# Patient Record
Sex: Male | Born: 1970 | Race: White | Hispanic: No | Marital: Married | Smoking: Former smoker
Health system: Southern US, Community
[De-identification: ages and names within clinical notes are randomized; demographics above are authoritative.]

## PROBLEM LIST (undated history)

## (undated) DIAGNOSIS — E0590 Thyrotoxicosis, unspecified without thyrotoxic crisis or storm: Secondary | ICD-10-CM

## (undated) DIAGNOSIS — F419 Anxiety disorder, unspecified: Secondary | ICD-10-CM

## (undated) DIAGNOSIS — E785 Hyperlipidemia, unspecified: Secondary | ICD-10-CM

---

## 2009-05-08 ENCOUNTER — Emergency Department: Payer: Self-pay | Admitting: Emergency Medicine

## 2009-05-16 ENCOUNTER — Emergency Department: Payer: Self-pay | Admitting: Unknown Physician Specialty

## 2010-02-18 ENCOUNTER — Emergency Department: Payer: Self-pay | Admitting: Unknown Physician Specialty

## 2010-08-02 ENCOUNTER — Inpatient Hospital Stay: Payer: Self-pay | Admitting: Psychiatry

## 2010-08-02 ENCOUNTER — Observation Stay: Payer: Self-pay | Admitting: Specialist

## 2011-05-25 ENCOUNTER — Emergency Department: Payer: Self-pay | Admitting: *Deleted

## 2013-05-18 DIAGNOSIS — Z0000 Encounter for general adult medical examination without abnormal findings: Secondary | ICD-10-CM | POA: Insufficient documentation

## 2013-05-26 DIAGNOSIS — E785 Hyperlipidemia, unspecified: Secondary | ICD-10-CM | POA: Insufficient documentation

## 2013-07-17 IMAGING — CR MANDIBLE - 1-3 VIEW
1 series · 3 of 3 positions shown · non-contrast
Comparison: none

REASON FOR EXAM: injury
COMMENTS:

PROCEDURE:     DXR - DXR MANDIBLE  PARTIAL  - May 25, 2011  [DATE]
RESULT:     There is no evidence of fracture, dislocation, or malalignment.

[Series 1: view not recorded · 0.17mm/px · 3 of 3 slices shown]
[im 1/3]
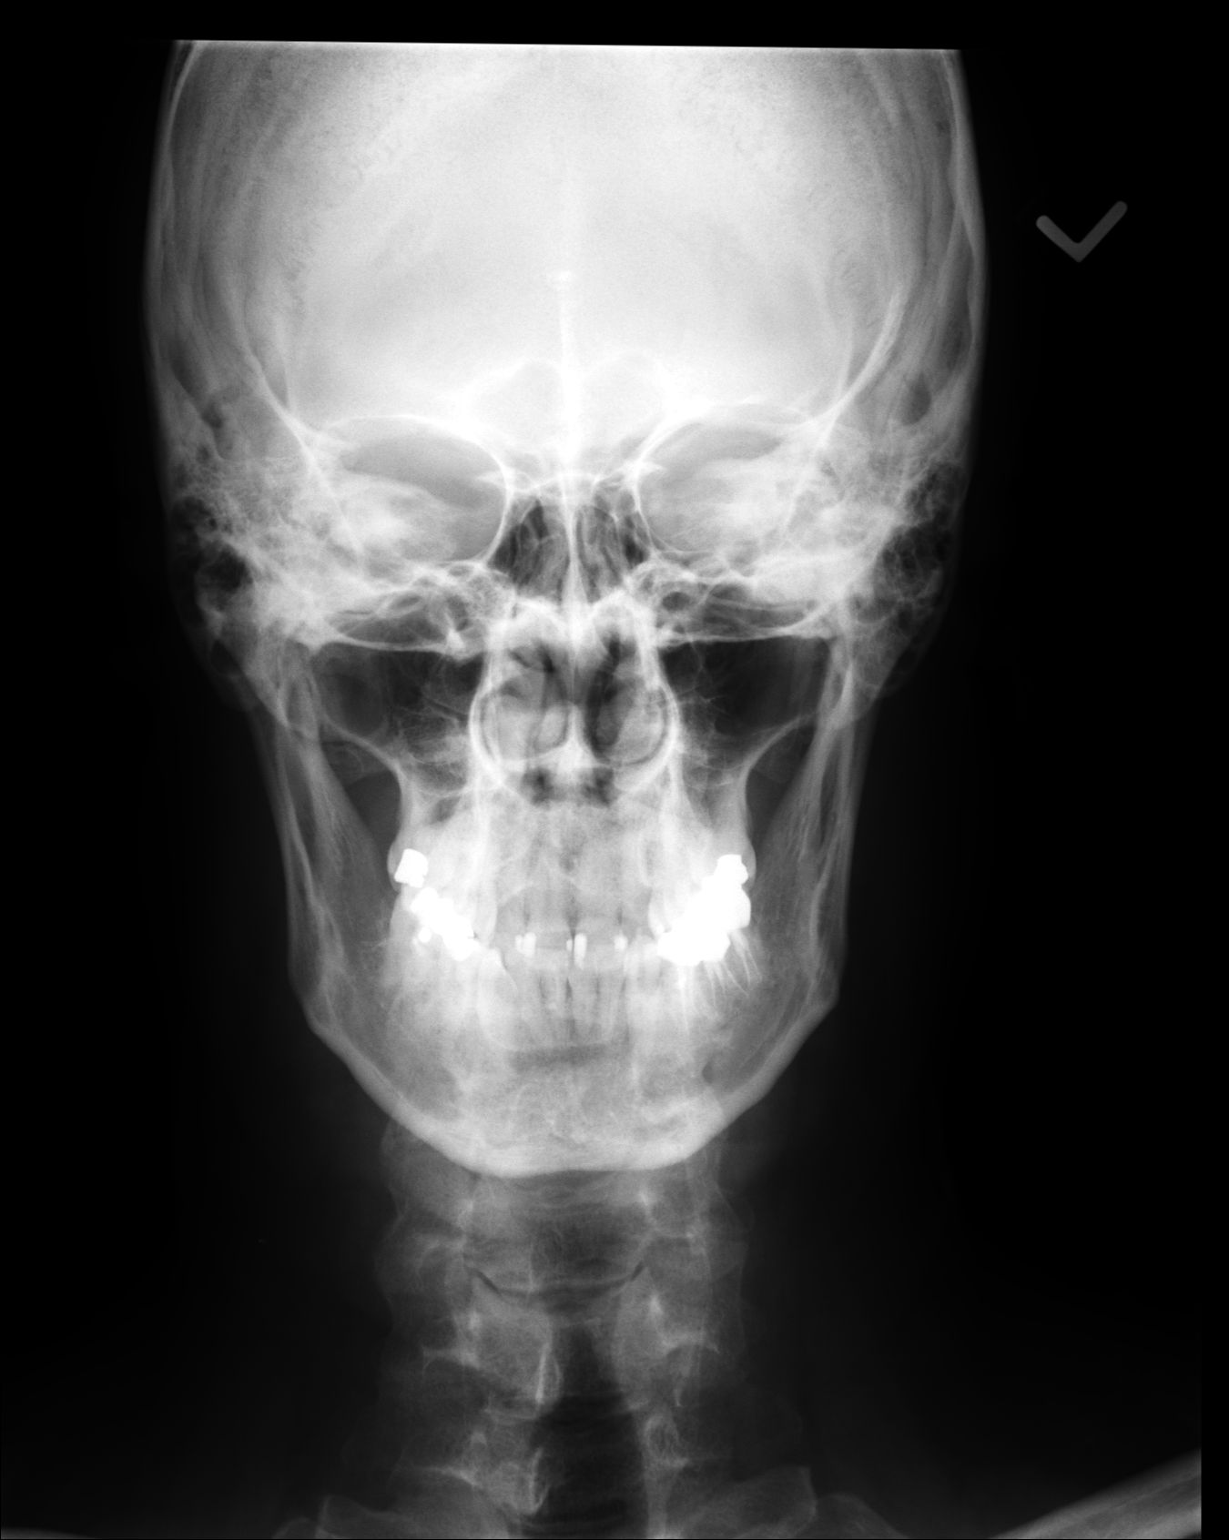
[im 2/3]
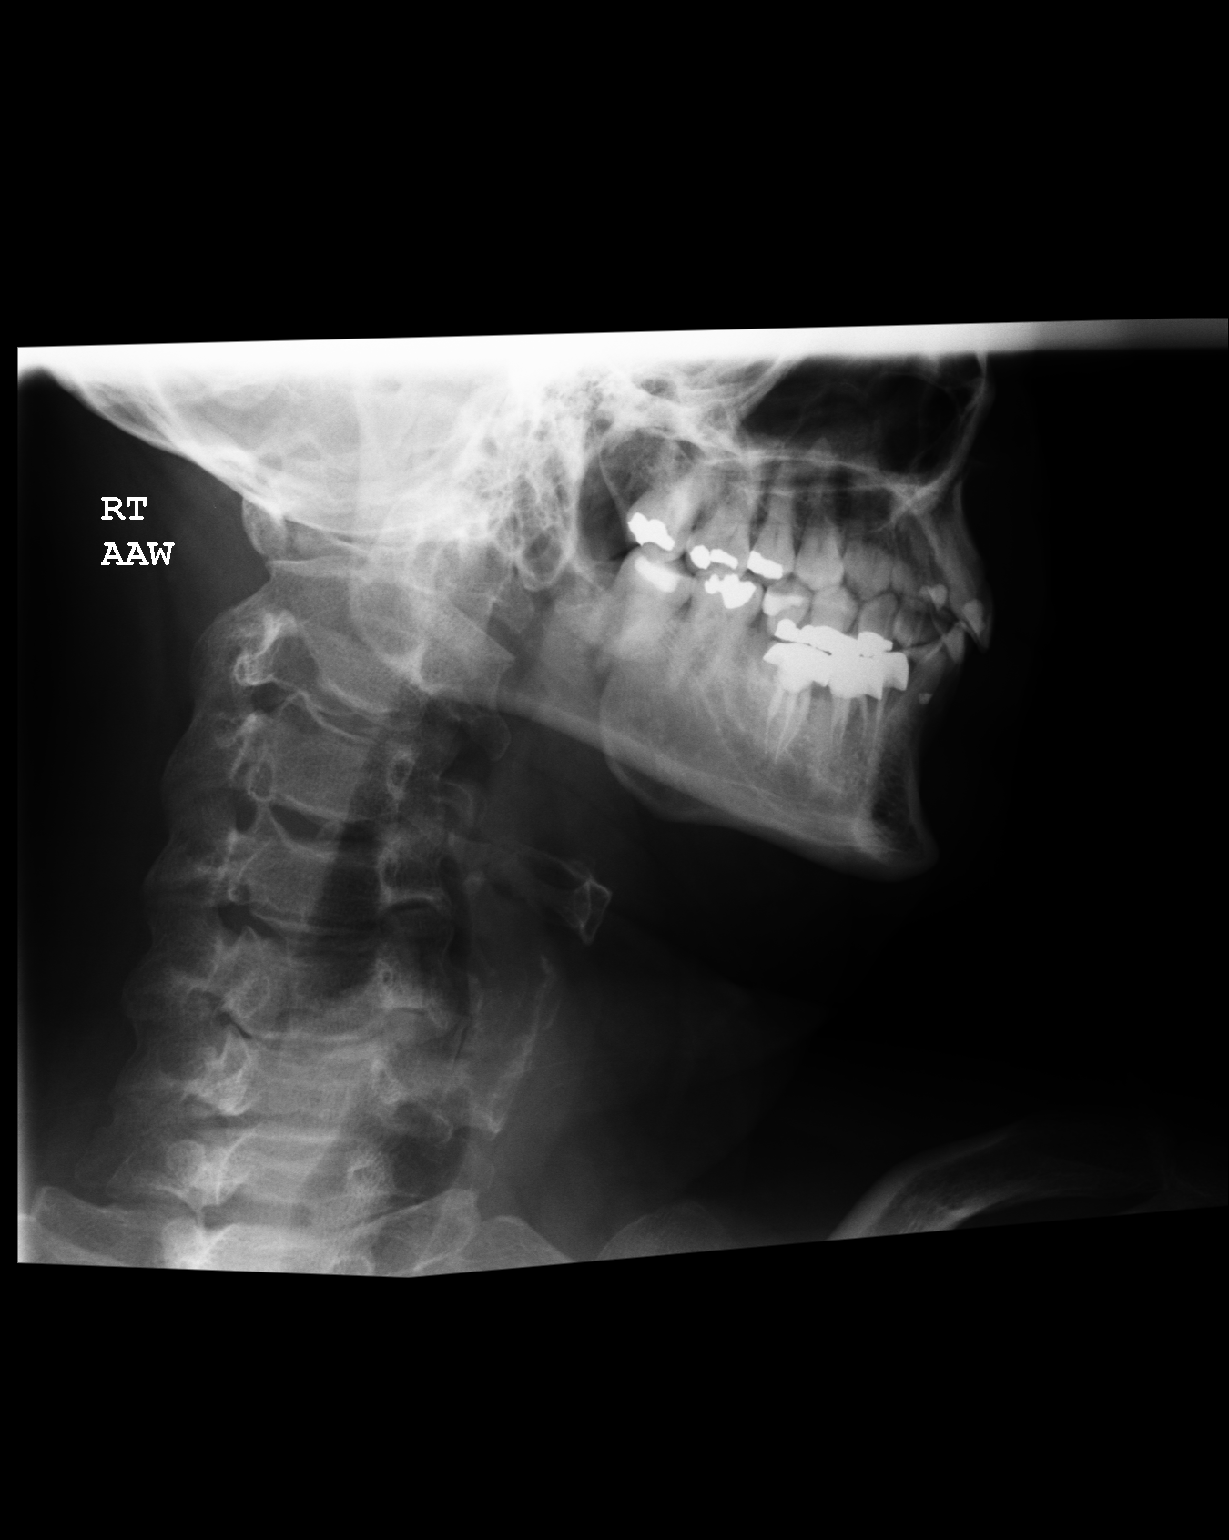
[im 3/3]
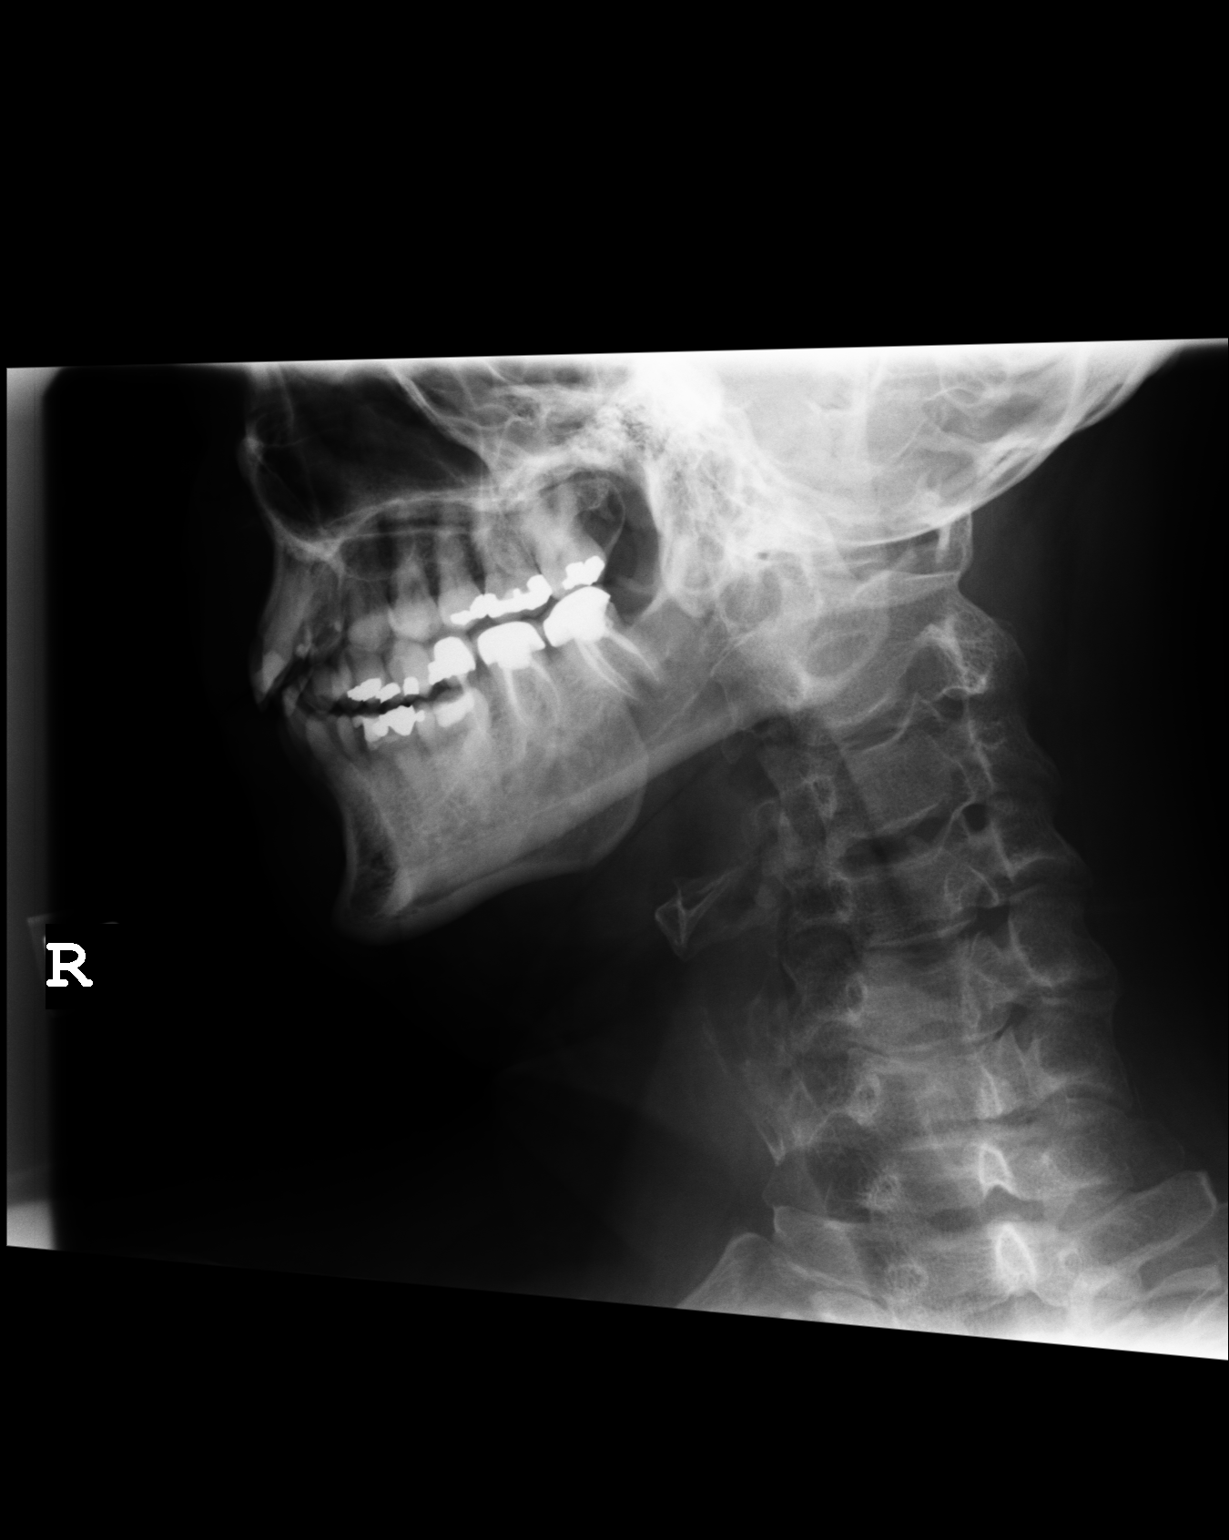

[3 of 3 positions shown; findings below may reference images not displayed]

IMPRESSION: 1. No evidence of acute abnormalities.
2. If there are persistent complaints of pain or persistent clinical
concern, a repeat evaluation in 7-10 days and/or CT is recommended if
clinically warranted.

## 2013-09-02 ENCOUNTER — Emergency Department: Payer: Self-pay | Admitting: Emergency Medicine

## 2015-10-13 DIAGNOSIS — F439 Reaction to severe stress, unspecified: Secondary | ICD-10-CM | POA: Insufficient documentation

## 2015-10-26 IMAGING — CR DG KNEE COMPLETE 4+V*R*
1 series · 4 of 4 positions shown · non-contrast
Comparison: None.

CLINICAL DATA: Injured knee skiing 4 weeks ago, continued pain

EXAM:
RIGHT KNEE - COMPLETE 4+ VIEW

[Series 1: oblique · 0.17mm/px · 4 of 4 slices shown]
[im 1/4]
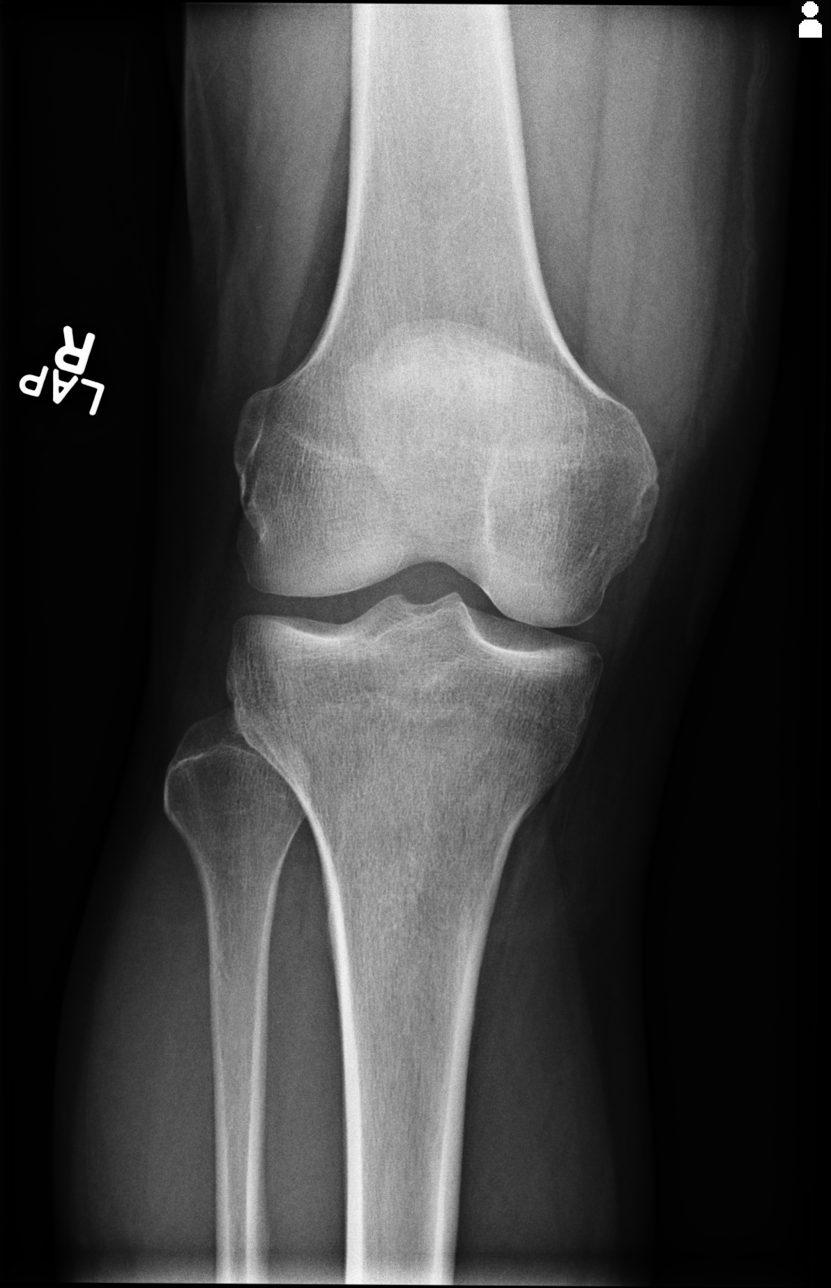
[im 2/4]
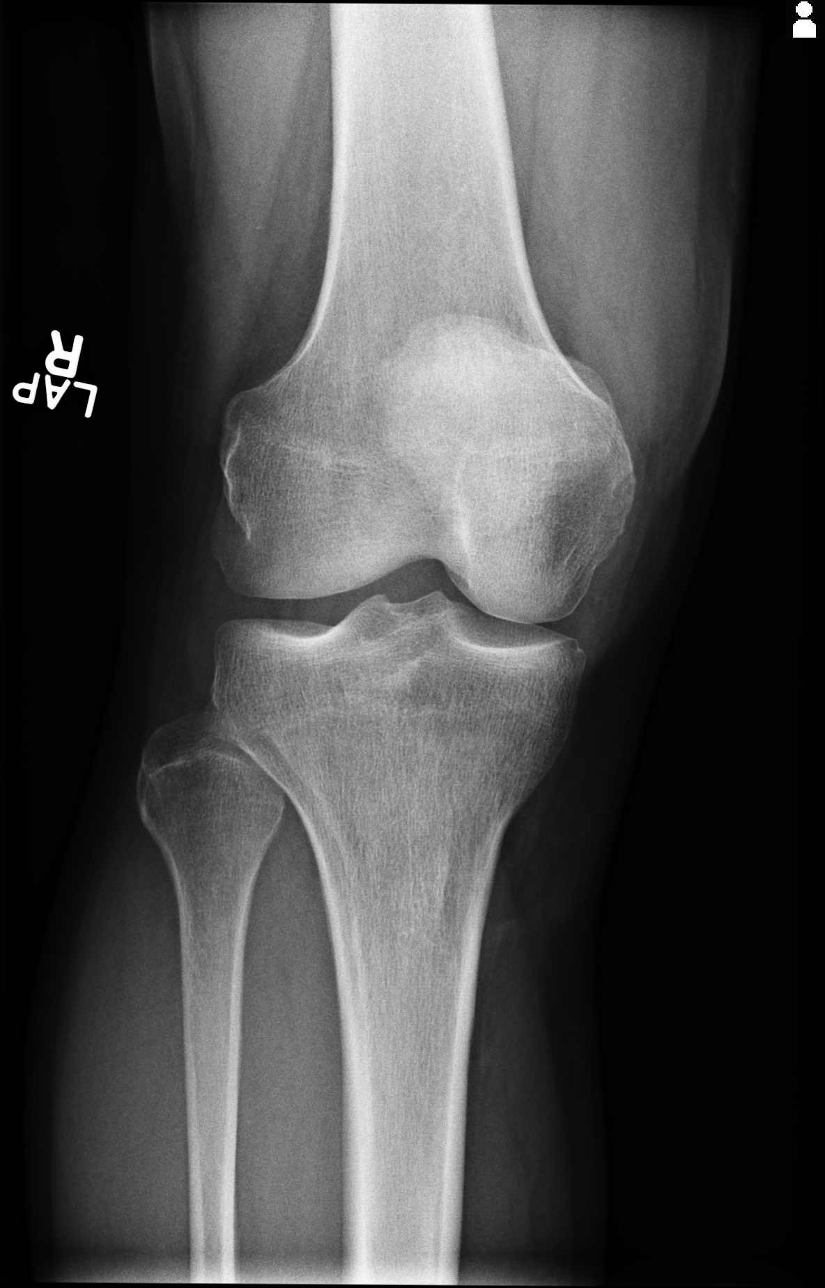
[im 3/4]
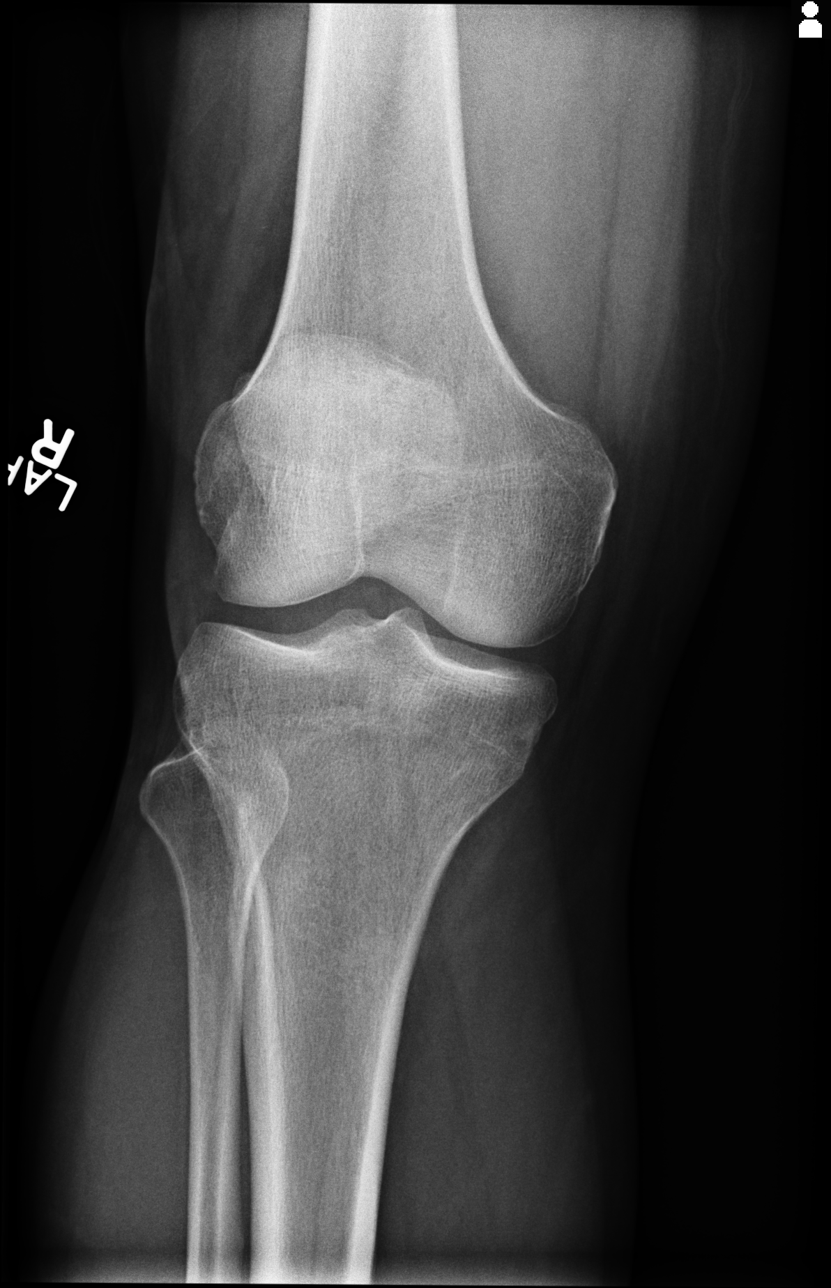
[im 4/4]
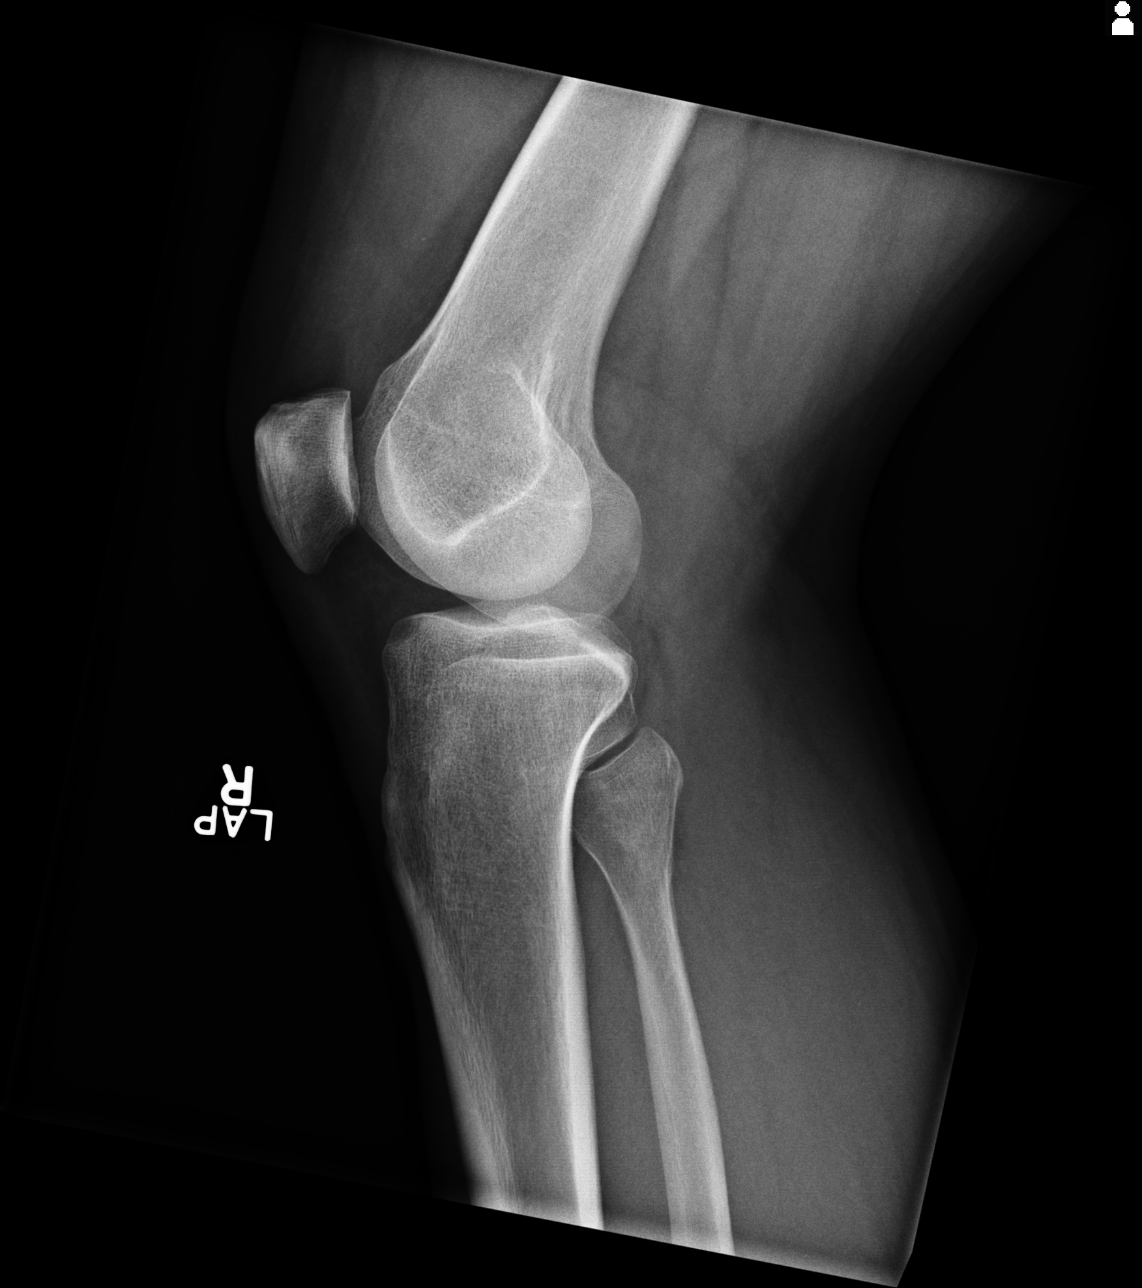

[4 of 4 positions shown; findings below may reference images not displayed]

FINDINGS: There is no evidence of fracture, dislocation, or joint effusion.
There is no evidence of arthropathy or other focal bone abnormality.
Soft tissues are unremarkable.
IMPRESSION: Negative.

## 2015-11-23 DIAGNOSIS — R1032 Left lower quadrant pain: Secondary | ICD-10-CM | POA: Insufficient documentation

## 2017-01-19 DIAGNOSIS — G479 Sleep disorder, unspecified: Secondary | ICD-10-CM | POA: Insufficient documentation

## 2017-06-08 ENCOUNTER — Ambulatory Visit: Payer: Self-pay | Admitting: Podiatry

## 2017-06-12 ENCOUNTER — Ambulatory Visit (INDEPENDENT_AMBULATORY_CARE_PROVIDER_SITE_OTHER): Payer: BLUE CROSS/BLUE SHIELD

## 2017-06-12 ENCOUNTER — Encounter: Payer: Self-pay | Admitting: Podiatry

## 2017-06-12 ENCOUNTER — Ambulatory Visit: Payer: BLUE CROSS/BLUE SHIELD

## 2017-06-12 ENCOUNTER — Ambulatory Visit: Payer: BLUE CROSS/BLUE SHIELD | Admitting: Podiatry

## 2017-06-12 DIAGNOSIS — M722 Plantar fascial fibromatosis: Secondary | ICD-10-CM

## 2017-06-12 MED ORDER — METHYLPREDNISOLONE 4 MG PO TBPK: ORAL_TABLET | ORAL | 0 refills | Status: DC

## 2017-06-12 MED ORDER — DICLOFENAC SODIUM 75 MG PO TBEC: 75.0000 mg | DELAYED_RELEASE_TABLET | Freq: Two times a day (BID) | ORAL | 1 refills | Status: DC

## 2017-06-12 NOTE — Progress Notes (Signed)
   Subjective:    Patient ID: Sean Campbell, male    DOB: [birth year], 46 y.o.   MRN: [id number]  HPI    Review of Systems  All other systems reviewed and are negative.      Objective:   Physical Exam        Assessment & Plan:

## 2017-06-14 NOTE — Progress Notes (Signed)
   Subjective: Patient presents today for pain and tenderness in the bilateral heels and ankles that has been ongoing for the past 3 months. He describes the pain as pins and needles when walking and throbbing when he is without movement. He states the pain started in the heels and now radiates into the ankles. His left is worse than the right and the pain is worse in the morning. He has not done anything to treat the symptoms. Patient presents today for further treatment and evaluation.   No past medical history on file.   Objective: Physical Exam General: The patient is alert and oriented x3 in no acute distress.  Dermatology: Skin is warm, dry and supple bilateral lower extremities. Negative for open lesions or macerations bilateral.   Vascular: Dorsalis Pedis and Posterior Tibial pulses palpable bilateral.  Capillary fill time is immediate to all digits.  Neurological: Epicritic and protective threshold intact bilateral.   Musculoskeletal: Tenderness to palpation at the medial calcaneal tubercale and through the insertion of the plantar fascia of the left foot. All other joints range of motion within normal limits bilateral. Strength 5/5 in all groups bilateral.   Radiographic exam:   Normal osseous mineralization. Joint spaces preserved. No fracture/dislocation/boney destruction. Calcaneal spur present with mild thickening of plantar fascia left. No other soft tissue abnormalities or radiopaque foreign bodies.   Assessment: 1. Plantar fasciitis left foot  Plan of Care:  1. Patient evaluated. Xrays reviewed.   2. Injection of 0.5cc Celestone soluspan injected into the left plantar fascia.  3. Rx for Medrol Dose Pak placed 4. Rx for Diclofenac 75mg  PO BID ordered for patient. 5. Plantar fascial band(s) dispensed  6. Instructed patient regarding therapies and modalities at home to alleviate symptoms.  7. Return to clinic in 4 weeks.     Edrick Kins, DPM [facility]  Dr. Edrick Kins, DPM    [address], [place] [address]                Office [phone]  Fax [phone]

## 2017-07-17 ENCOUNTER — Ambulatory Visit (INDEPENDENT_AMBULATORY_CARE_PROVIDER_SITE_OTHER): Payer: BLUE CROSS/BLUE SHIELD | Admitting: Podiatry

## 2017-07-17 ENCOUNTER — Encounter: Payer: Self-pay | Admitting: Podiatry

## 2017-07-17 DIAGNOSIS — G4700 Insomnia, unspecified: Secondary | ICD-10-CM | POA: Insufficient documentation

## 2017-07-17 DIAGNOSIS — M722 Plantar fascial fibromatosis: Secondary | ICD-10-CM

## 2017-07-17 DIAGNOSIS — N529 Male erectile dysfunction, unspecified: Secondary | ICD-10-CM | POA: Insufficient documentation

## 2017-07-19 NOTE — Progress Notes (Signed)
   HPI: 47 year old male presenting today for follow up evaluation of left plantar fasciitis. He states he is doing much better and reports being 95% better. He has been wearing the fascial brace and taking Diclofenac with moderate relief. Patient is here for further evaluation and treatment.   No past medical history on file.   Physical Exam: General: The patient is alert and oriented x3 in no acute distress.  Dermatology: Skin is warm, dry and supple bilateral lower extremities. Negative for open lesions or macerations.  Vascular: Palpable pedal pulses bilaterally. No edema or erythema noted. Capillary refill within normal limits.  Neurological: Epicritic and protective threshold grossly intact bilaterally.   Musculoskeletal Exam: Range of motion within normal limits to all pedal and ankle joints bilateral. Muscle strength 5/5 in all groups bilateral.    Assessment: - plantar fasciitis left - resolved   Plan of Care:  - Patient evaluated. - Recommended good shoe gear. - Continue wearing plantar fascial brace and taking Diclofenac as needed. - Return to clinic when necessary.    Sean Campbell, DPM [facility]  Dr. Edrick Campbell, DPM    [address],  [address]                Office [phone]  Fax [phone]

## 2017-08-15 ENCOUNTER — Telehealth: Payer: Self-pay | Admitting: *Deleted

## 2017-08-15 MED ORDER — DICLOFENAC SODIUM 75 MG PO TBEC: 75.0000 mg | DELAYED_RELEASE_TABLET | Freq: Two times a day (BID) | ORAL | 1 refills | Status: DC

## 2017-08-15 NOTE — Telephone Encounter (Signed)
Pt left name and phone number. 

## 2017-08-15 NOTE — Telephone Encounter (Signed)
Pt requested refill of the diclofenac, states he is starting to have pain again, is having to go to [place] to work and will be walking a lot. Dr. Amalia Hailey states refill as previously.

## 2017-08-24 ENCOUNTER — Ambulatory Visit (INDEPENDENT_AMBULATORY_CARE_PROVIDER_SITE_OTHER): Payer: BLUE CROSS/BLUE SHIELD

## 2017-08-24 ENCOUNTER — Ambulatory Visit (INDEPENDENT_AMBULATORY_CARE_PROVIDER_SITE_OTHER): Payer: BLUE CROSS/BLUE SHIELD | Admitting: Podiatry

## 2017-08-24 ENCOUNTER — Encounter: Payer: Self-pay | Admitting: Podiatry

## 2017-08-24 DIAGNOSIS — S93692A Other sprain of left foot, initial encounter: Secondary | ICD-10-CM

## 2017-08-24 MED ORDER — DIAZEPAM 5 MG PO TABS: 5.0000 mg | ORAL_TABLET | Freq: Two times a day (BID) | ORAL | 1 refills | Status: DC | PRN

## 2017-08-24 NOTE — Progress Notes (Signed)
   HPI: Patient presents today for new complaint regarding pain and tenderness to the left foot.  Patient states that his plantar fasciitis was doing very well until he was working and he twisted his foot and he heard a loud audible pop on the bottom of his foot.  He was immediately not able to bear weight on his foot.  He states the pain is gotten worse over the last 2 days.  The pain in the patient's symptoms have gotten worse and is unable to sleep at night and he states that he has some anxiety regarding his current condition.  He presents for further treatment evaluation   No past medical history on file.   Physical Exam: General: The patient is alert and oriented x3 in no acute distress.  Dermatology: Skin is warm, dry and supple bilateral lower extremities. Negative for open lesions or macerations.  Vascular: Palpable pedal pulses bilaterally. No edema or erythema noted. Capillary refill within normal limits.  Neurological: Epicritic and protective threshold grossly intact bilaterally.   Musculoskeletal Exam: Range of motion within normal limits to all pedal and ankle joints bilateral. Muscle strength 5/5 in all groups bilateral.  Moderate edema with significant pain on palpation to the lateral band of the plantar fascia mid arch.  Likely consistent with a possible plantar fascial rupture.   Assessment: -Rupture plantar fascia left foot lateral band   Plan of Care:  - Patient evaluated. -Today we are going to dispense an immobilization cam boot.  Weightbearing as tolerated times 4 weeks. -Continue taking oral diclofenac 75 mg twice daily -Prescription for Valium  5 mg #30 1 refill as needed anxiety due to current condition -Return to clinic in 4 weeks   Edrick Kins, DPM [facility]  Dr. Edrick Kins, DPM    [address], [place] [address]                Office [phone]  Fax [phone]

## 2017-09-21 ENCOUNTER — Ambulatory Visit: Payer: BLUE CROSS/BLUE SHIELD | Admitting: Podiatry

## 2017-09-21 ENCOUNTER — Ambulatory Visit: Payer: Self-pay | Admitting: Unknown Physician Specialty

## 2017-09-28 ENCOUNTER — Encounter: Payer: Self-pay | Admitting: Unknown Physician Specialty

## 2017-09-28 ENCOUNTER — Ambulatory Visit (INDEPENDENT_AMBULATORY_CARE_PROVIDER_SITE_OTHER): Payer: BLUE CROSS/BLUE SHIELD | Admitting: Unknown Physician Specialty

## 2017-09-28 VITALS — BP 132/76 | HR 76 | Temp 97.8°F | Ht 69.3 in | Wt 215.7 lb

## 2017-09-28 DIAGNOSIS — F322 Major depressive disorder, single episode, severe without psychotic features: Secondary | ICD-10-CM | POA: Diagnosis not present

## 2017-09-28 DIAGNOSIS — L290 Pruritus ani: Secondary | ICD-10-CM

## 2017-09-28 DIAGNOSIS — Z1211 Encounter for screening for malignant neoplasm of colon: Secondary | ICD-10-CM

## 2017-09-28 DIAGNOSIS — G4730 Sleep apnea, unspecified: Secondary | ICD-10-CM | POA: Insufficient documentation

## 2017-09-28 DIAGNOSIS — G4733 Obstructive sleep apnea (adult) (pediatric): Secondary | ICD-10-CM | POA: Diagnosis not present

## 2017-09-28 DIAGNOSIS — F5101 Primary insomnia: Secondary | ICD-10-CM

## 2017-09-28 MED ORDER — CITALOPRAM HYDROBROMIDE 20 MG PO TABS: 20.0000 mg | ORAL_TABLET | Freq: Every day | ORAL | 3 refills | Status: DC

## 2017-09-28 NOTE — Patient Instructions (Addendum)

## 2017-09-28 NOTE — Assessment & Plan Note (Addendum)
Unable to complete a sleep study previously.  Schedule sleep study

## 2017-09-28 NOTE — Progress Notes (Signed)
BP 132/76   Pulse 76   Temp 97.8 F (36.6 C) (Oral)   Ht 5' 9.3" (1.76 m)   Wt 215 lb 11.2 oz (97.8 kg)   SpO2 98%   BMI 31.58 kg/m    Subjective:    Patient ID: Sean Campbell, male    DOB: 1970-10-25, 47 y.o.   MRN: [id number]  HPI: Sean Campbell is a 47 y.o. male  Chief Complaint  Patient presents with  . Establish Care  . Depression   Depression Struggles with depression.  Tried Valium which makes him sleepy and groggy.  Has a friend who recently got on Cymbalta which helped.  Sleep is a problem.    Depression screen PHQ 2/9 09/28/2017  Decreased Interest 3  Down, Depressed, Hopeless 2  PHQ - 2 Score 5  Altered sleeping 3  Tired, decreased energy 3  Change in appetite 3  Feeling bad or failure about yourself  1  Trouble concentrating 3  Moving slowly or fidgety/restless 2  Suicidal thoughts 1  PHQ-9 Score 21   Hyperlipidemia Noted last time he had a physical.  States he put on some weight.    The 10-year ASCVD risk score Mikey Bussing DC Brooke Bonito., et al., 2013) is: 6.3%   Values used to calculate the score:     Age: 74 years     Sex: Male     Is Non-Hispanic African American: No     Diabetic: No     Tobacco smoker: No     Systolic Blood Pressure: 825 mmHg     Is BP treated: No     HDL Cholesterol: 41 mg/dL     Total Cholesterol: 269 mg/dL   Rectal itching Noted at night.  Also due to colon cancer screening.  Sleep apnea Snores at night.  Tired most of the time.  Often needs to pull over.  Wakes up exhausted.    Social History   Socioeconomic History  . Marital status: Married    Spouse name: Not on file  . Number of children: Not on file  . Years of education: Not on file  . Highest education level: Not on file  Occupational History  . Not on file  Social Needs  . Financial resource strain: Not on file  . Food insecurity:    Worry: Not on file    Inability: Not on file  . Transportation needs:    Medical: Not on file    Non-medical: Not on file  Tobacco  Use  . Smoking status: Former Research scientist (life sciences)  . Smokeless tobacco: Never Used  Substance and Sexual Activity  . Alcohol use: Yes    Comment: 6 pack per week  . Drug use: No  . Sexual activity: Yes  Lifestyle  . Physical activity:    Days per week: Not on file    Minutes per session: Not on file  . Stress: Not on file  Relationships  . Social connections:    Talks on phone: Not on file    Gets together: Not on file    Attends religious service: Not on file    Active member of club or organization: Not on file    Attends meetings of clubs or organizations: Not on file    Relationship status: Not on file  . Intimate partner violence:    Fear of current or ex partner: Not on file    Emotionally abused: Not on file    Physically abused: Not on file  Forced sexual activity: Not on file  Other Topics Concern  . Not on file  Social History Narrative  . Not on file   Family History  Problem Relation Age of Onset  . Heart disease Father   . Cancer Maternal Grandmother        colon  . Heart disease Paternal Grandmother   . Heart disease Paternal Grandfather    History reviewed. No pertinent past medical history.  History reviewed. No pertinent surgical history.    Relevant past medical, surgical, family and social history reviewed and updated as indicated. Interim medical history since our last visit reviewed. Allergies and medications reviewed and updated.  Review of Systems  Per HPI unless specifically indicated above     Objective:    BP 132/76   Pulse 76   Temp 97.8 F (36.6 C) (Oral)   Ht 5' 9.3" (1.76 m)   Wt 215 lb 11.2 oz (97.8 kg)   SpO2 98%   BMI 31.58 kg/m   Wt Readings from Last 3 Encounters:  09/28/17 215 lb 11.2 oz (97.8 kg)    Physical Exam  Constitutional: He is oriented to person, place, and time. He appears well-developed and well-nourished. No distress.  HENT:  Head: Normocephalic and atraumatic.  Eyes: Conjunctivae and lids are normal. Right  eye exhibits no discharge. Left eye exhibits no discharge. No scleral icterus.  Neck: Normal range of motion. Neck supple. No JVD present. Carotid bruit is not present.  Cardiovascular: Normal rate, regular rhythm and normal heart sounds.  Pulmonary/Chest: Effort normal and breath sounds normal. No respiratory distress.  Abdominal: Normal appearance. There is no splenomegaly or hepatomegaly.  Musculoskeletal: Normal range of motion.  Neurological: He is alert and oriented to person, place, and time.  Skin: Skin is warm, dry and intact. No rash noted. No pallor.  Psychiatric: He has a normal mood and affect. His behavior is normal. Judgment and thought content normal.    No results found for this or any previous visit.    Assessment & Plan:   Problem List Items Addressed This Visit      Unprioritized   Depression, major, single episode, severe ([place])    Start Citalopram 20 mg daily. I've explained to her that drugs of the SSRI class can have side effects such as weight gain, sexual dysfunction, insomnia, headache, nausea. These medications are generally effective at alleviating symptoms of anxiety and/or depression. Let me know if significant side effects do occur.        Relevant Medications   citalopram (CELEXA) 20 MG tablet   Insomnia    Significant problem.  Discussed sleep hygeine.  Exercise      Sleep apnea    Unable to complete a sleep study previously.  Schedule sleep study      Relevant Orders   Ambulatory referral to Sleep Studies    Other Visit Diagnoses    Rectal itching    -  Primary   Relevant Orders   Ambulatory referral to Gastroenterology   Colon cancer screening       Relevant Orders   Ambulatory referral to Gastroenterology      Last labs done at [facility] 01/19/17.  Reviewed.  Everything normal but LDL 191.  Pt ed  Follow up plan: Return in about 1 month (around 10/26/2017) for physcal.

## 2017-09-28 NOTE — Assessment & Plan Note (Signed)
Significant problem.  Discussed sleep hygeine.  Exercise

## 2017-09-28 NOTE — Assessment & Plan Note (Signed)
Start Citalopram 20 mg daily. I've explained to her that drugs of the SSRI class can have side effects such as weight gain, sexual dysfunction, insomnia, headache, nausea. These medications are generally effective at alleviating symptoms of anxiety and/or depression. Let me know if significant side effects do occur.

## 2017-10-10 ENCOUNTER — Other Ambulatory Visit: Payer: Self-pay

## 2017-10-10 ENCOUNTER — Encounter: Payer: Self-pay | Admitting: Gastroenterology

## 2017-10-10 ENCOUNTER — Ambulatory Visit (INDEPENDENT_AMBULATORY_CARE_PROVIDER_SITE_OTHER): Payer: BLUE CROSS/BLUE SHIELD | Admitting: Gastroenterology

## 2017-10-10 VITALS — BP 133/76 | HR 65 | Ht 69.3 in | Wt 210.4 lb

## 2017-10-10 DIAGNOSIS — Z1211 Encounter for screening for malignant neoplasm of colon: Secondary | ICD-10-CM

## 2017-10-10 DIAGNOSIS — Z800 Family history of malignant neoplasm of digestive organs: Secondary | ICD-10-CM

## 2017-10-10 MED ORDER — PEG 3350-KCL-NA BICARB-NACL 420 G PO SOLR: 4000.0000 mL | Freq: Once | ORAL | 0 refills | Status: AC

## 2017-10-10 MED ORDER — BISACODYL 5 MG PO TBEC: 10.0000 mg | DELAYED_RELEASE_TABLET | Freq: Once | ORAL | 0 refills | Status: AC

## 2017-10-28 NOTE — Progress Notes (Signed)
Vonda Antigua [address]  [place]  [place], [place] [address]  Main: [phone]  Fax: [phone]   Gastroenterology Consultation  Referring Provider:     Kathrine Haddock, NP Primary Care Physician:  Kathrine Haddock, NP Primary Gastroenterologist:  Dr. Vonda Antigua Reason for Consultation:     Rectal itching        HPI:   Sean Campbell is a 47 y.o. y/o male referred for consultation & management  by Dr. Kathrine Haddock, NP.  Patient reports 2 to 20-month history of itching.  Occurs multiple times a day, does not occur at night.  No exacerbating or relieving factors.  No blood per rectum.  No weight loss, no abdominal pain.  No altered bowel habits.  No family history of colon cancer.  No previous colonoscopy.  History reviewed. No pertinent past medical history.  History reviewed. No pertinent surgical history.  Prior to Admission medications   Medication Sig Start Date End Date Taking? Authorizing Provider  citalopram (CELEXA) 20 MG tablet Take 1 tablet (20 mg total) by mouth daily. 09/28/17  Yes Kathrine Haddock, NP  diazepam (VALIUM) 5 MG tablet Take 1 tablet (5 mg total) by mouth every 12 (twelve) hours as needed for anxiety. 08/24/17  Yes Edrick Kins, DPM  sildenafil (VIAGRA) 100 MG tablet USE AS DIRECTED. DO NOT TAKE MORE THAN 100 MG IN 24 HOURS 01/19/17  Yes [provider]    Family History  Problem Relation Age of Onset  . Heart disease Father   . Cancer Maternal Grandmother        colon  . Heart disease Paternal Grandmother   . Heart disease Paternal Grandfather      Social History   Tobacco Use  . Smoking status: Former Research scientist (life sciences)  . Smokeless tobacco: Never Used  Substance Use Topics  . Alcohol use: Yes    Comment: 6 pack per week  . Drug use: No    Allergies as of 10/10/2017  . (No Known Allergies)    Review of Systems:    All systems reviewed and negative except where noted in HPI.   Physical Exam:  BP 133/76   Pulse 65    Ht 5' 9.3" (1.76 m)   Wt 210 lb 6.4 oz (95.4 kg)   BMI 30.80 kg/m  No LMP for male patient. Psych:  Alert and cooperative. Normal mood and affect. General:   Alert,  Well-developed, well-nourished, pleasant and cooperative in NAD Head:  Normocephalic and atraumatic. Eyes:  Sclera clear, no icterus.   Conjunctiva pink. Ears:  Normal auditory acuity. Nose:  No deformity, discharge, or lesions. Mouth:  No deformity or lesions,oropharynx pink & moist. Neck:  Supple; no masses or thyromegaly. Lungs:  Respirations even and unlabored.  Clear throughout to auscultation.   No wheezes, crackles, or rhonchi. No acute distress. Heart:  Regular rate and rhythm; no murmurs, clicks, rubs, or gallops. Abdomen:  Normal bowel sounds.  No bruits.  Soft, non-tender and non-distended without masses, hepatosplenomegaly or hernias noted.  No guarding or rebound tenderness.    Msk:  Symmetrical without gross deformities. Good, equal movement & strength bilaterally. Pulses:  Normal pulses noted. Extremities:  No clubbing or edema.  No cyanosis. Neurologic:  Alert and oriented x3;  grossly normal neurologically. Skin:  Intact without significant lesions or rashes. No jaundice. Lymph Nodes:  No significant cervical adenopathy. Psych:  Alert and cooperative. Normal mood and affect.   Labs: CBC No results found for: WBC, RBC,  HGB, HCT, PLT, MCV, MCH, MCHC, RDW, LYMPHSABS, MONOABS, EOSABS, BASOSABS CMP  No results found for: NA, K, CL, CO2, GLUCOSE, BUN, CREATININE, CALCIUM, PROT, ALBUMIN, AST, ALT, ALKPHOS, BILITOT, GFRNONAA, GFRAA  Imaging Studies: No results found.  Assessment and Plan:   [name] is a 47 y.o. y/o male has been referred for rectal itching, and evaluation for screening colonoscopy  Rectum is doing possibly due to underlying hemorrhoids High-fiber diet recommended No alarm symptoms present  [place] recommend screening colonoscopy starting at 50 years of age.  However,  other GI socities,  recommend colonoscopies at 49 years of age, but recognize my American cancer Society guidelines.  This was discussed with the patient in detail.  Alternatives, risks and benefits discussed.  Patient chooses to have colonoscopy scheduled this time. Rectal exam, can be done at that time to evaluate for hemorrhoids, and etiology of itching  Other causes of itching, can include anal fissures or tears Patient asked to not wear tight clothing, not irritate the area with itching Keep the area dry and clean Will evaluate with colonoscopy  I have discussed alternative options, risks & benefits,  which include, but are not limited to, bleeding, infection, perforation,respiratory complication & drug reaction.  The patient agrees with this plan & written consent will be obtained.      Dr Vonda Antigua

## 2017-10-30 ENCOUNTER — Encounter: Payer: Self-pay | Admitting: Unknown Physician Specialty

## 2017-10-30 ENCOUNTER — Ambulatory Visit (INDEPENDENT_AMBULATORY_CARE_PROVIDER_SITE_OTHER): Payer: BLUE CROSS/BLUE SHIELD | Admitting: Unknown Physician Specialty

## 2017-10-30 ENCOUNTER — Encounter: Payer: BLUE CROSS/BLUE SHIELD | Admitting: Unknown Physician Specialty

## 2017-10-30 VITALS — BP 129/83 | HR 69 | Temp 98.2°F | Ht 69.3 in | Wt 211.6 lb

## 2017-10-30 DIAGNOSIS — L290 Pruritus ani: Secondary | ICD-10-CM | POA: Insufficient documentation

## 2017-10-30 DIAGNOSIS — F322 Major depressive disorder, single episode, severe without psychotic features: Secondary | ICD-10-CM | POA: Diagnosis not present

## 2017-10-30 DIAGNOSIS — Z0000 Encounter for general adult medical examination without abnormal findings: Secondary | ICD-10-CM | POA: Diagnosis not present

## 2017-10-30 DIAGNOSIS — G4733 Obstructive sleep apnea (adult) (pediatric): Secondary | ICD-10-CM | POA: Diagnosis not present

## 2017-10-30 MED ORDER — ALBENDAZOLE 200 MG PO TABS: ORAL_TABLET | ORAL | 0 refills | Status: DC

## 2017-10-30 MED ORDER — CITALOPRAM HYDROBROMIDE 20 MG PO TABS: 20.0000 mg | ORAL_TABLET | Freq: Every day | ORAL | 1 refills | Status: DC

## 2017-10-30 NOTE — Assessment & Plan Note (Signed)
Has not yet had sleep test

## 2017-10-30 NOTE — Assessment & Plan Note (Signed)
Discussed pinworms.  Pt doesn't want to do any confirmatory observance.  Agrees to presumptive treatment with Albendazole

## 2017-10-30 NOTE — Assessment & Plan Note (Signed)
This is resolved with current treatment

## 2017-10-30 NOTE — Progress Notes (Signed)
BP 129/83   Pulse 69   Temp 98.2 F (36.8 C) (Oral)   Ht 5' 9.3" (1.76 m)   Wt 211 lb 9.6 oz (96 kg)   SpO2 98%   BMI 30.98 kg/m    Subjective:    Patient ID: Sean Campbell, male    DOB: 1971-01-16, 47 y.o.   MRN: [id number]  HPI: Sean Campbell is a 47 y.o. male  Chief Complaint  Patient presents with  . Annual Exam   Depression Started Citalopram 20 mg.  States mood is better.  Has a slight headache. Not taking the Valoium Depression screen [facility] 2/9 10/30/2017 09/28/2017  Decreased Interest 0 3  Down, Depressed, Hopeless 0 2  PHQ - 2 Score 0 5  Altered sleeping 1 3  Tired, decreased energy 1 3  Change in appetite 0 3  Feeling bad or failure about yourself  0 1  Trouble concentrating 0 3  Moving slowly or fidgety/restless 0 2  Suicidal thoughts 0 1  PHQ-9 Score 2 21   Rectal itching Pt with continued persistent rectal itching at night.    Social History   Socioeconomic History  . Marital status: Married    Spouse name: Not on file  . Number of children: Not on file  . Years of education: Not on file  . Highest education level: Not on file  Occupational History  . Not on file  Social Needs  . Financial resource strain: Not on file  . Food insecurity:    Worry: Not on file    Inability: Not on file  . Transportation needs:    Medical: Not on file    Non-medical: Not on file  Tobacco Use  . Smoking status: Former Research scientist (life sciences)  . Smokeless tobacco: Never Used  Substance and Sexual Activity  . Alcohol use: Yes    Comment: 6 pack per week  . Drug use: No  . Sexual activity: Yes  Lifestyle  . Physical activity:    Days per week: Not on file    Minutes per session: Not on file  . Stress: Not on file  Relationships  . Social connections:    Talks on phone: Not on file    Gets together: Not on file    Attends religious service: Not on file    Active member of club or organization: Not on file    Attends meetings of clubs or organizations: Not on file   Relationship status: Not on file  . Intimate partner violence:    Fear of current or ex partner: Not on file    Emotionally abused: Not on file    Physically abused: Not on file    Forced sexual activity: Not on file  Other Topics Concern  . Not on file  Social History Narrative  . Not on file   Family History  Problem Relation Age of Onset  . Heart disease Father   . Cancer Maternal Grandmother        colon  . Heart disease Paternal Grandmother   . Heart disease Paternal Grandfather    History reviewed. No pertinent past medical history.  History reviewed. No pertinent surgical history.   Relevant past medical, surgical, family and social history reviewed and updated as indicated. Interim medical history since our last visit reviewed. Allergies and medications reviewed and updated.  Review of Systems  Per HPI unless specifically indicated above     Objective:    BP 129/83   Pulse 69  Temp 98.2 F (36.8 C) (Oral)   Ht 5' 9.3" (1.76 m)   Wt 211 lb 9.6 oz (96 kg)   SpO2 98%   BMI 30.98 kg/m   Wt Readings from Last 3 Encounters:  10/30/17 211 lb 9.6 oz (96 kg)  10/10/17 210 lb 6.4 oz (95.4 kg)  09/28/17 215 lb 11.2 oz (97.8 kg)    Physical Exam  Constitutional: He is oriented to person, place, and time. He appears well-developed and well-nourished.  HENT:  Head: Normocephalic.  Right Ear: Tympanic membrane, external ear and ear canal normal.  Left Ear: Tympanic membrane, external ear and ear canal normal.  Mouth/Throat: Uvula is midline, oropharynx is clear and moist and mucous membranes are normal.  Eyes: Pupils are equal, round, and reactive to light.  Cardiovascular: Normal rate, regular rhythm and normal heart sounds. Exam reveals no gallop and no friction rub.  No murmur heard. Pulmonary/Chest: Effort normal and breath sounds normal. No respiratory distress.  Abdominal: Soft. Bowel sounds are normal. He exhibits no distension. There is no tenderness.    Genitourinary: Rectum normal and prostate normal.  Musculoskeletal: Normal range of motion.  Neurological: He is alert and oriented to person, place, and time. He has normal reflexes.  Skin: Skin is warm and dry.  Psychiatric: He has a normal mood and affect. His behavior is normal. Judgment and thought content normal.    No results found for this or any previous visit.                      Assessment & Plan:   Problem List Items Addressed This Visit      Unprioritized   Depression, major, single episode, severe ([place])    This is resolved with current treatment      Rectal itching    Discussed pinworms.  Pt doesn't want to do any confirmatory observance.  Agrees to presumptive treatment with Albendazole      Sleep apnea    Has not yet had sleep test       Other Visit Diagnoses    Annual physical exam    -  Primary   Relevant Orders   CBC with Differential/Platelet   Comprehensive metabolic panel   Lipid Panel w/o Chol/HDL Ratio   TSH   PSA       Follow up plan: Return in about 6 months (around 05/01/2018).

## 2017-10-31 LAB — CBC WITH DIFFERENTIAL/PLATELET
BASOS ABS: 0 10*3/uL (ref 0.0–0.2)
Basos: 1 %
EOS (ABSOLUTE): 0.4 10*3/uL (ref 0.0–0.4)
Eos: 6 %
Hematocrit: 44.2 % (ref 37.5–51.0)
Hemoglobin: 15.4 g/dL (ref 13.0–17.7)
Immature Grans (Abs): 0 10*3/uL (ref 0.0–0.1)
Immature Granulocytes: 1 %
LYMPHS ABS: 1.8 10*3/uL (ref 0.7–3.1)
LYMPHS: 26 %
MCH: 32 pg (ref 26.6–33.0)
MCHC: 34.8 g/dL (ref 31.5–35.7)
MCV: 92 fL (ref 79–97)
Monocytes Absolute: 0.4 10*3/uL (ref 0.1–0.9)
Monocytes: 6 %
Neutrophils Absolute: 4.4 10*3/uL (ref 1.4–7.0)
Neutrophils: 60 %
PLATELETS: 239 10*3/uL (ref 150–379)
RBC: 4.81 x10E6/uL (ref 4.14–5.80)
RDW: 14.4 % (ref 12.3–15.4)
WBC: 7.1 10*3/uL (ref 3.4–10.8)

## 2017-10-31 LAB — PSA: Prostate Specific Ag, Serum: 0.6 ng/mL (ref 0.0–4.0)

## 2017-10-31 LAB — COMPREHENSIVE METABOLIC PANEL
ALK PHOS: 64 IU/L (ref 39–117)
ALT: 62 IU/L — AB (ref 0–44)
AST: 28 IU/L (ref 0–40)
Albumin/Globulin Ratio: 1.9 (ref 1.2–2.2)
Albumin: 4.8 g/dL (ref 3.5–5.5)
BUN/Creatinine Ratio: 14 (ref 9–20)
BUN: 11 mg/dL (ref 6–24)
Bilirubin Total: 0.4 mg/dL (ref 0.0–1.2)
CHLORIDE: 102 mmol/L (ref 96–106)
CO2: 22 mmol/L (ref 20–29)
Calcium: 9.6 mg/dL (ref 8.7–10.2)
Creatinine, Ser: 0.8 mg/dL (ref 0.76–1.27)
GFR calc Af Amer: 121 mL/min/{1.73_m2} (ref >59)
GFR calc non Af Amer: 105 mL/min/{1.73_m2} (ref >59)
GLUCOSE: 83 mg/dL (ref 65–99)
Globulin, Total: 2.5 g/dL (ref 1.5–4.5)
Potassium: 4.5 mmol/L (ref 3.5–5.2)
Sodium: 141 mmol/L (ref 134–144)
Total Protein: 7.3 g/dL (ref 6.0–8.5)

## 2017-10-31 LAB — LIPID PANEL W/O CHOL/HDL RATIO
CHOLESTEROL TOTAL: 278 mg/dL — AB (ref 100–199)
HDL: 47 mg/dL (ref >39)
LDL Calculated: 190 mg/dL — ABNORMAL HIGH (ref 0–99)
Triglycerides: 205 mg/dL — ABNORMAL HIGH (ref 0–149)
VLDL CHOLESTEROL CAL: 41 mg/dL — AB (ref 5–40)

## 2017-10-31 LAB — TSH: TSH: 4.79 u[IU]/mL — ABNORMAL HIGH (ref 0.450–4.500)

## 2017-11-02 ENCOUNTER — Telehealth: Payer: Self-pay | Admitting: Unknown Physician Specialty

## 2017-11-02 MED ORDER — ATORVASTATIN CALCIUM 20 MG PO TABS: 20.0000 mg | ORAL_TABLET | Freq: Every day | ORAL | 3 refills | Status: DC

## 2017-11-02 MED ORDER — MEBENDAZOLE 100 MG PO CHEW: CHEWABLE_TABLET | ORAL | 0 refills | Status: DC

## 2017-11-02 MED ORDER — HYDROCORTISONE 2.5 % RE CREA: 1.0000 "application " | TOPICAL_CREAM | Freq: Two times a day (BID) | RECTAL | 0 refills | Status: DC

## 2017-11-02 MED ORDER — LEVOTHYROXINE SODIUM 25 MCG PO CAPS: 25.0000 ug | ORAL_CAPSULE | Freq: Every day | ORAL | 1 refills | Status: DC

## 2017-11-02 NOTE — Telephone Encounter (Signed)
Discussed with pt about TSH and cholestoerl.  Complaints of fatigue and weight gain.  Will start Levothyroxine 25 mcgs.  Start on Atorvastatin 20 mg daily.  Change pinworm medication due to cost.

## 2017-11-06 ENCOUNTER — Telehealth: Payer: Self-pay | Admitting: Unknown Physician Specialty

## 2017-11-06 NOTE — Telephone Encounter (Signed)
[place], can you help me by trying to figure out what is covered by calling the pharmacy for alternatives?

## 2017-11-06 NOTE — Telephone Encounter (Signed)
Copied from [place] [id number]. Topic: Quick Communication - See Telephone Encounter >> Nov 06, 2017 11:34 AM Cleaster Corin, NT wrote: CRM for notification. See Telephone encounter for: 11/06/17.  Levothyroxine Sodium 25 MCG CAPS [[id number]] and mebendazole (VERMOX) 100 MG chewable tablet [[id number]] are not covered by pt. Insurance seeing if something else could be called in  [id number] - [place], [place] - [address] [place] [address] Phone: [phone] Fax: [phone]

## 2017-11-07 NOTE — Telephone Encounter (Signed)
This appears to be OTC.  Please ask to take about 1,000 mgs once and repeat in 2 weeks.

## 2017-11-07 NOTE — Telephone Encounter (Signed)
Called and spoke with patient, explained what was going on with them. Patient states that he is just going to pick up the RX that is not covered and pay the price for it.

## 2017-11-07 NOTE — Telephone Encounter (Signed)
Called pharmacy regarding levothyroxine. They stated that they did not even have a prescription on file for levothyroxine. So I called it in verbally and it went through the patient's insurance fine.   For the mebendazole, we received a fax from the patient's insurance stating that they may consider covering this medication after a try and fail of a medication called pyrantel pamoate.

## 2017-11-08 DIAGNOSIS — J029 Acute pharyngitis, unspecified: Secondary | ICD-10-CM | POA: Diagnosis not present

## 2017-11-15 ENCOUNTER — Telehealth: Payer: Self-pay | Admitting: Unknown Physician Specialty

## 2017-11-15 ENCOUNTER — Telehealth: Payer: Self-pay

## 2017-11-15 ENCOUNTER — Encounter: Payer: Self-pay | Admitting: *Deleted

## 2017-11-15 DIAGNOSIS — B09 Unspecified viral infection characterized by skin and mucous membrane lesions: Secondary | ICD-10-CM | POA: Diagnosis not present

## 2017-11-15 DIAGNOSIS — L509 Urticaria, unspecified: Secondary | ICD-10-CM | POA: Diagnosis not present

## 2017-11-15 NOTE — Telephone Encounter (Signed)
Patient is needing a script sent [facility] for [facility] as he is states he is out of this med.  Thank You

## 2017-11-15 NOTE — Telephone Encounter (Signed)
Pt had called earlier because the hospital called to let him know of his time for tomorrow for his colonoscopy (5/10) and pt thought it was 6/26. As I was speaking to pt he found some paperwork from our office in his trunk but what this paper sounded like when he read it that it was his f/u appt and I checked his chart and he did have a 3 month f/u on 01/02/18. We did move his colonoscopy to 6/26 and notified Trish at [facility] scheduling. Informed pt that if he could not find his prep instructions to let me know and we would send them but he thinks he had them. Will send pt a reminder to have f/u a couple of weeks after his procedure on 6/26 since his appt was the same day.

## 2017-11-15 NOTE — Telephone Encounter (Signed)
We have never written that Rx. It was written by his foot doctor. He will need an appointment or to get it from him

## 2017-11-15 NOTE — Telephone Encounter (Signed)
Spoke with patient. He will call back to schedule an appointment if he is no better in a few days.

## 2018-01-02 ENCOUNTER — Ambulatory Visit: Payer: BLUE CROSS/BLUE SHIELD | Admitting: Gastroenterology

## 2018-01-02 ENCOUNTER — Ambulatory Visit
Admission: RE | Admit: 2018-01-02 | Discharge: 2018-01-02 | Disposition: A | Discharge status: Home / Self Care | Payer: BLUE CROSS/BLUE SHIELD | Source: Ambulatory Visit | Attending: Gastroenterology | Admitting: Gastroenterology

## 2018-01-02 ENCOUNTER — Encounter: Payer: Self-pay | Admitting: *Deleted

## 2018-01-02 ENCOUNTER — Encounter
Admission: RE | Disposition: A | Discharge status: Home / Self Care | Payer: Self-pay | Source: Ambulatory Visit | Attending: Gastroenterology

## 2018-01-02 ENCOUNTER — Ambulatory Visit: Payer: BLUE CROSS/BLUE SHIELD | Admitting: Anesthesiology

## 2018-01-02 ENCOUNTER — Other Ambulatory Visit: Payer: Self-pay

## 2018-01-02 DIAGNOSIS — K5790 Diverticulosis of intestine, part unspecified, without perforation or abscess without bleeding: Secondary | ICD-10-CM | POA: Diagnosis not present

## 2018-01-02 DIAGNOSIS — K635 Polyp of colon: Secondary | ICD-10-CM | POA: Diagnosis not present

## 2018-01-02 DIAGNOSIS — K648 Other hemorrhoids: Secondary | ICD-10-CM | POA: Diagnosis not present

## 2018-01-02 DIAGNOSIS — Z79899 Other long term (current) drug therapy: Secondary | ICD-10-CM | POA: Diagnosis not present

## 2018-01-02 DIAGNOSIS — Z800 Family history of malignant neoplasm of digestive organs: Secondary | ICD-10-CM

## 2018-01-02 DIAGNOSIS — Z8249 Family history of ischemic heart disease and other diseases of the circulatory system: Secondary | ICD-10-CM | POA: Insufficient documentation

## 2018-01-02 DIAGNOSIS — K5730 Diverticulosis of large intestine without perforation or abscess without bleeding: Secondary | ICD-10-CM

## 2018-01-02 DIAGNOSIS — F329 Major depressive disorder, single episode, unspecified: Secondary | ICD-10-CM | POA: Insufficient documentation

## 2018-01-02 DIAGNOSIS — Z1211 Encounter for screening for malignant neoplasm of colon: Secondary | ICD-10-CM

## 2018-01-02 DIAGNOSIS — F419 Anxiety disorder, unspecified: Secondary | ICD-10-CM | POA: Insufficient documentation

## 2018-01-02 DIAGNOSIS — Z87891 Personal history of nicotine dependence: Secondary | ICD-10-CM | POA: Insufficient documentation

## 2018-01-02 DIAGNOSIS — E785 Hyperlipidemia, unspecified: Secondary | ICD-10-CM | POA: Diagnosis not present

## 2018-01-02 DIAGNOSIS — E0590 Thyrotoxicosis, unspecified without thyrotoxic crisis or storm: Secondary | ICD-10-CM | POA: Insufficient documentation

## 2018-01-02 DIAGNOSIS — D125 Benign neoplasm of sigmoid colon: Secondary | ICD-10-CM | POA: Insufficient documentation

## 2018-01-02 HISTORY — DX: Thyrotoxicosis, unspecified without thyrotoxic crisis or storm: E05.90

## 2018-01-02 HISTORY — DX: Anxiety disorder, unspecified: F41.9

## 2018-01-02 HISTORY — DX: Hyperlipidemia, unspecified: E78.5

## 2018-01-02 HISTORY — PX: COLONOSCOPY WITH PROPOFOL: SHX5780

## 2018-01-02 SURGERY — COLONOSCOPY WITH PROPOFOL
Anesthesia: General

## 2018-01-02 MED ORDER — PROPOFOL 10 MG/ML IV BOLUS
INTRAVENOUS | Status: DC | PRN
  Administered 2018-01-02: 100 mg via INTRAVENOUS

## 2018-01-02 MED ORDER — LIDOCAINE HCL (PF) 2 % IJ SOLN
INTRAMUSCULAR | Status: AC
  Filled 2018-01-02: qty 10

## 2018-01-02 MED ORDER — PHENYLEPHRINE HCL 10 MG/ML IJ SOLN
INTRAMUSCULAR | Status: DC | PRN
  Administered 2018-01-02: 100 ug via INTRAVENOUS

## 2018-01-02 MED ORDER — LIDOCAINE 2% (20 MG/ML) 5 ML SYRINGE
INTRAMUSCULAR | Status: DC | PRN
  Administered 2018-01-02: 30 mg via INTRAVENOUS

## 2018-01-02 MED ORDER — SODIUM CHLORIDE 0.9 % IV SOLN: INTRAVENOUS | Status: DC

## 2018-01-02 MED ORDER — PROPOFOL 500 MG/50ML IV EMUL
INTRAVENOUS | Status: AC
  Filled 2018-01-02: qty 50

## 2018-01-02 MED ORDER — PROPOFOL 500 MG/50ML IV EMUL
INTRAVENOUS | Status: DC | PRN
  Administered 2018-01-02: 160 ug/kg/min via INTRAVENOUS

## 2018-01-02 MED ORDER — PROPOFOL 10 MG/ML IV BOLUS
INTRAVENOUS | Status: AC
  Filled 2018-01-02: qty 20

## 2018-01-02 MED ORDER — SODIUM CHLORIDE 0.9 % IV SOLN
INTRAVENOUS | Status: DC
  Administered 2018-01-02 (×2): via INTRAVENOUS

## 2018-01-02 MED ORDER — FENTANYL CITRATE (PF) 100 MCG/2ML IJ SOLN
INTRAMUSCULAR | Status: AC
  Filled 2018-01-02: qty 2

## 2018-01-02 MED ORDER — FENTANYL CITRATE (PF) 100 MCG/2ML IJ SOLN
INTRAMUSCULAR | Status: DC | PRN
  Administered 2018-01-02 (×2): 50 ug via INTRAVENOUS

## 2018-01-02 NOTE — H&P (Signed)
Sean Antigua, MD [address] [place], [place], [place], [address] [address], [place], [place], [place], [address] Phone: [phone]  Fax: [phone]  Primary Care Physician:  Kathrine Haddock, NP   Pre-Procedure History & Physical: HPI:  Sean Campbell is a 47 y.o. male is here for a colonoscopy.   Past Medical History:  Diagnosis Date  . Anxiety   . Hyperlipemia   . Hyperthyroidism     History reviewed. No pertinent surgical history.  Prior to Admission medications   Medication Sig Start Date End Date Taking? Authorizing Provider  atorvastatin (LIPITOR) 20 MG tablet Take 1 tablet (20 mg total) by mouth daily. 11/02/17  Yes Kathrine Haddock, NP  citalopram (CELEXA) 20 MG tablet Take 1 tablet (20 mg total) by mouth daily. 10/30/17  Yes Kathrine Haddock, NP  diazepam (VALIUM) 5 MG tablet Take 1 tablet (5 mg total) by mouth every 12 (twelve) hours as needed for anxiety. 08/24/17  Yes Edrick Kins, DPM  hydrocortisone (ANUSOL-HC) 2.5 % rectal cream Place 1 application rectally 2 (two) times daily. 11/02/17  Yes Kathrine Haddock, NP  Levothyroxine Sodium 25 MCG CAPS Take 1 capsule (25 mcg total) by mouth daily before breakfast. 11/02/17  Yes Kathrine Haddock, NP  sildenafil (VIAGRA) 100 MG tablet USE AS DIRECTED. DO NOT TAKE MORE THAN 100 MG IN 24 HOURS 01/19/17  Yes [provider]  albendazole (ALBENZA) 200 MG tablet Take 2 tablet now and 2 in 2 weeks Patient not taking: Reported on 01/02/2018 10/30/17   Kathrine Haddock, NP  mebendazole (VERMOX) 100 MG chewable tablet Take 2 tablets now and repeat in 2 weeks Patient not taking: Reported on 01/02/2018 11/02/17   Kathrine Haddock, NP    Allergies as of 10/11/2017  . (No Known Allergies)    Family History  Problem Relation Age of Onset  . Heart disease Father   . Cancer Maternal Grandmother        colon  . Heart disease Paternal Grandmother   . Heart disease Paternal Grandfather     Social History   Socioeconomic  History  . Marital status: Married    Spouse name: Not on file  . Number of children: Not on file  . Years of education: Not on file  . Highest education level: Not on file  Occupational History  . Not on file  Social Needs  . Financial resource strain: Not on file  . Food insecurity:    Worry: Not on file    Inability: Not on file  . Transportation needs:    Medical: Not on file    Non-medical: Not on file  Tobacco Use  . Smoking status: Former Research scientist (life sciences)  . Smokeless tobacco: Never Used  Substance and Sexual Activity  . Alcohol use: Yes    Alcohol/week: 4.8 oz    Types: 8 Cans of beer per week    Comment: 6 pack per week  . Drug use: No  . Sexual activity: Yes  Lifestyle  . Physical activity:    Days per week: Not on file    Minutes per session: Not on file  . Stress: Not on file  Relationships  . Social connections:    Talks on phone: Not on file    Gets together: Not on file    Attends religious service: Not on file    Active member of club or organization: Not on file    Attends meetings of clubs or organizations: Not on file    Relationship  status: Not on file  . Intimate partner violence:    Fear of current or ex partner: Not on file    Emotionally abused: Not on file    Physically abused: Not on file    Forced sexual activity: Not on file  Other Topics Concern  . Not on file  Social History Narrative  . Not on file    Review of Systems: See HPI, otherwise negative ROS  Physical Exam: BP 121/67   Pulse 72   Temp (!) 96.9 F (36.1 C) (Tympanic)   Resp 16   Ht 5\' 9"  (1.753 m)   Wt 195 lb (88.5 kg)   SpO2 100%   BMI 28.80 kg/m  General:   Alert,  pleasant and cooperative in NAD Head:  Normocephalic and atraumatic. Neck:  Supple; no masses or thyromegaly. Lungs:  Clear throughout to auscultation, normal respiratory effort.    Heart:  +S1, +S2, Regular rate and rhythm, No edema. Abdomen:  Soft, nontender and nondistended. Normal bowel sounds, without  guarding, and without rebound.   Neurologic:  Alert and  oriented x4;  grossly normal neurologically.  Impression/Plan: Sean Campbell is here for a colonoscopy to be performed for average risk screening.  Risks, benefits, limitations, and alternatives regarding  colonoscopy have been reviewed with the patient.  Questions have been answered.  All parties agreeable.   Virgel Manifold, MD  01/02/2018, 8:40 AM

## 2018-01-02 NOTE — Anesthesia Post-op Follow-up Note (Signed)
Anesthesia QCDR form completed.        

## 2018-01-02 NOTE — Anesthesia Preprocedure Evaluation (Addendum)
Anesthesia Evaluation  Patient identified by MRN, date of birth, ID band Patient awake    Reviewed: Allergy & Precautions, H&P , NPO status , Patient's Chart, lab work & pertinent test results, reviewed documented beta blocker date and time   Airway Mallampati: I  TM Distance: >3 FB Neck ROM: full    Dental  (+) Dental Advidsory Given, Caps, Teeth Intact   Pulmonary neg pulmonary ROS, former smoker,           Cardiovascular Exercise Tolerance: Good negative cardio ROS       Neuro/Psych PSYCHIATRIC DISORDERS Anxiety Depression negative neurological ROS     GI/Hepatic negative GI ROS, Neg liver ROS,   Endo/Other  neg diabetesHypothyroidism   Renal/GU negative Renal ROS  negative genitourinary   Musculoskeletal   Abdominal   Peds  Hematology negative hematology ROS (+)   Anesthesia Other Findings Past Medical History: No date: Anxiety No date: Hyperlipemia No date: Hyperthyroidism   Reproductive/Obstetrics negative OB ROS                            Anesthesia Physical Anesthesia Plan  ASA: II  Anesthesia Plan: General   Post-op Pain Management:    Induction: Intravenous  PONV Risk Score and Plan: 2 and Propofol infusion  Airway Management Planned: Nasal Cannula  Additional Equipment:   Intra-op Plan:   Post-operative Plan:   Informed Consent: I have reviewed the patients History and Physical, chart, labs and discussed the procedure including the risks, benefits and alternatives for the proposed anesthesia with the patient or authorized representative who has indicated his/her understanding and acceptance.   Dental Advisory Given  Plan Discussed with: Anesthesiologist, CRNA and Surgeon  Anesthesia Plan Comments:         Anesthesia Quick Evaluation

## 2018-01-02 NOTE — Transfer of Care (Signed)
Immediate Anesthesia Transfer of Care Note  Patient: Sean Campbell  Procedure(s) Performed: COLONOSCOPY WITH PROPOFOL (N/A )  Patient Location: PACU and Endoscopy Unit  Anesthesia Type:General  Level of Consciousness: sedated  Airway & Oxygen Therapy: Patient Spontanous Breathing and Patient connected to nasal cannula oxygen  Post-op Assessment: Report given to RN and Post -op Vital signs reviewed and stable  Post vital signs: Reviewed and stable  Last Vitals:  Vitals Value Taken Time  BP 94/54 01/02/2018  9:44 AM  Temp 36.1 C 01/02/2018  9:44 AM  Pulse 58 01/02/2018  9:45 AM  Resp 13 01/02/2018  9:45 AM  SpO2 98 % 01/02/2018  9:45 AM  Vitals shown include unvalidated device data.  Last Pain:  Vitals:   01/02/18 0944  TempSrc: Tympanic  PainSc:          Complications: No apparent anesthesia complications

## 2018-01-02 NOTE — Op Note (Signed)
[facility] Gastroenterology Patient Name: Sean Campbell Procedure Date: 01/02/2018 8:41 AM MRN: [id number] Account #: [id number] Date of Birth: [birth year] Admit Type: Outpatient Age: 47 Room: [facility] ENDO ROOM 2 Gender: Male Note Status: Finalized Procedure:            Colonoscopy Indications:          Screening for colorectal malignant neoplasm, Pt                        reported rectal itching which has improved                        significantly after PCP treated it with mebendazole for                        presumptive pinworms Providers:            Tranell Wojtkiewicz B. Bonna Gains MD, MD Referring MD:         Kathrine Haddock (Referring MD) Medicines:            Monitored Anesthesia Care Complications:        No immediate complications. Procedure:            Pre-Anesthesia Assessment:                       - ASA Grade Assessment: II - A patient with mild                        systemic disease.                       - Prior to the procedure, a History and Physical was                        performed, and patient medications, allergies and                        sensitivities were reviewed. The patient's tolerance of                        previous anesthesia was reviewed.                       - The risks and benefits of the procedure and the                        sedation options and risks were discussed with the                        patient. All questions were answered and informed                        consent was obtained.                       - Patient identification and proposed procedure were                        verified prior to the procedure by the physician, the  nurse, the anesthesiologist, the anesthetist and the                        technician. The procedure was verified in the procedure                        room.                       After obtaining informed consent, the colonoscope was                        passed under  direct vision. Throughout the procedure,                        the patient's blood pressure, pulse, and oxygen                        saturations were monitored continuously. The                        Colonoscope was introduced through the anus and                        advanced to the the cecum, identified by appendiceal                        orifice and ileocecal valve. The colonoscopy was                        performed with ease. The patient tolerated the                        procedure well. The quality of the bowel preparation                        was good. Findings:      The perianal and digital rectal examinations were normal.      Two sessile polyps were found in the sigmoid colon. The polyps were 2 to       3 mm in size. These polyps were removed with a cold biopsy forceps.       Resection and retrieval were complete.      A 5 mm polyp was found in the sigmoid colon. The polyp was sessile. The       polyp was removed with a cold snare. Resection and retrieval were       complete.      Multiple diverticula were found in the sigmoid colon.      The exam was otherwise without abnormality.      The rectum, sigmoid colon, descending colon, transverse colon, ascending       colon and cecum appeared normal.      Non-bleeding internal hemorrhoids were found during retroflexion. Impression:           - Two 2 to 3 mm polyps in the sigmoid colon, removed                        with a cold biopsy forceps. Resected and retrieved.                       -  One 5 mm polyp in the sigmoid colon, removed with a                        cold snare. Resected and retrieved.                       - Diverticulosis in the sigmoid colon.                       - The examination was otherwise normal.                       - The rectum, sigmoid colon, descending colon,                        transverse colon, ascending colon and cecum are normal.                       - Non-bleeding internal  hemorrhoids. Recommendation:       - Discharge patient to home (with escort).                       - Advance diet as tolerated.                       - Continue present medications.                       - Await pathology results.                       - Repeat colonoscopy in 5 years for surveillance.                       - The findings and recommendations were discussed with                        the patient.                       - The findings and recommendations were discussed with                        the patient's family.                       - Return to primary care physician as previously                        scheduled.                       - High fiber diet.                       - Rectal itching has improved with mebendazole that was                        prescribed by PCP. If it reoccurs steroid suppositories                        for internal hemorrhoids can be used. Procedure Code(s):    --- Professional ---  45385, Colonoscopy, flexible; with removal of tumor(s),                        polyp(s), or other lesion(s) by snare technique                       45380, 59, Colonoscopy, flexible; with biopsy, single                        or multiple Diagnosis Code(s):    --- Professional ---                       D12.5, Benign neoplasm of sigmoid colon                       Z12.11, Encounter for screening for malignant neoplasm                        of colon                       K64.8, Other hemorrhoids                       K57.30, Diverticulosis of large intestine without                        perforation or abscess without bleeding CPT copyright 2017 American Medical Association. All rights reserved. The codes documented in this report are preliminary and upon coder review may  be revised to meet current compliance requirements.  Vonda Antigua, MD Margretta Sidle B. Bonna Gains MD, MD 01/02/2018 9:43:08 AM This report has been signed  electronically. Number of Addenda: 0 Note Initiated On: 01/02/2018 8:41 AM Scope Withdrawal Time: 0 hours 35 minutes 50 seconds  Total Procedure Duration: 0 hours 40 minutes 51 seconds  Estimated Blood Loss: Estimated blood loss: none.      [facility]

## 2018-01-02 NOTE — Anesthesia Postprocedure Evaluation (Signed)
Anesthesia Post Note  Patient: Database administrator  Procedure(s) Performed: COLONOSCOPY WITH PROPOFOL (N/A )  Patient location during evaluation: Endoscopy Anesthesia Type: General Level of consciousness: awake and alert Pain management: pain level controlled Vital Signs Assessment: post-procedure vital signs reviewed and stable Respiratory status: spontaneous breathing, nonlabored ventilation, respiratory function stable and patient connected to nasal cannula oxygen Cardiovascular status: blood pressure returned to baseline and stable Postop Assessment: no apparent nausea or vomiting Anesthetic complications: no     Last Vitals:  Vitals:   01/02/18 0944 01/02/18 0946  BP: (!) 94/54 (!) 94/54  Pulse:  (!) 58  Resp:  13  Temp: (!) 36.1 C (!) 36.1 C  SpO2:  98%    Last Pain:  Vitals:   01/02/18 0944  TempSrc: Tympanic  PainSc:                  Martha Clan

## 2018-01-03 LAB — SURGICAL PATHOLOGY

## 2018-01-04 ENCOUNTER — Encounter: Payer: Self-pay | Admitting: Gastroenterology

## 2018-01-07 ENCOUNTER — Encounter: Payer: Self-pay | Admitting: Gastroenterology

## 2018-02-08 ENCOUNTER — Ambulatory Visit (INDEPENDENT_AMBULATORY_CARE_PROVIDER_SITE_OTHER): Payer: BLUE CROSS/BLUE SHIELD

## 2018-02-08 ENCOUNTER — Encounter: Payer: Self-pay | Admitting: Podiatry

## 2018-02-08 ENCOUNTER — Ambulatory Visit (INDEPENDENT_AMBULATORY_CARE_PROVIDER_SITE_OTHER): Payer: BLUE CROSS/BLUE SHIELD | Admitting: Podiatry

## 2018-02-08 DIAGNOSIS — M779 Enthesopathy, unspecified: Secondary | ICD-10-CM

## 2018-02-08 DIAGNOSIS — M778 Other enthesopathies, not elsewhere classified: Secondary | ICD-10-CM

## 2018-02-08 MED ORDER — DICLOFENAC SODIUM 75 MG PO TBEC: 75.0000 mg | DELAYED_RELEASE_TABLET | Freq: Two times a day (BID) | ORAL | 1 refills | Status: DC

## 2018-02-08 MED ORDER — METHYLPREDNISOLONE 4 MG PO TBPK: ORAL_TABLET | ORAL | 0 refills | Status: DC

## 2018-02-12 NOTE — Progress Notes (Signed)
   HPI: 47 year old male presenting today with a chief complaint of sharp, constant aching pain to the dorsum of the left foot that began 2 months ago. He reports associated numbness and tingling of the same area. Walking increases the pain. He has been taking Diclofenac and Ibuprofen with no significant relief. Patient is here for further evaluation and treatment.   Past Medical History:  Diagnosis Date  . Anxiety   . Hyperlipemia   . Hyperthyroidism      Physical Exam: General: The patient is alert and oriented x3 in no acute distress.  Dermatology: Skin is warm, dry and supple bilateral lower extremities. Negative for open lesions or macerations.  Vascular: Palpable pedal pulses bilaterally. No edema or erythema noted. Capillary refill within normal limits.  Neurological: Epicritic and protective threshold grossly intact bilaterally.   Musculoskeletal Exam: Pain with palpation noted to the base of the 2nd and 3rd MPJs of the left foot. Range of motion within normal limits to all pedal and ankle joints bilateral. Muscle strength 5/5 in all groups bilateral.   Radiographic Exam:  Stress fracture noted to base of 2nd and 3rd metatarsals of the left foot.    Assessment: 1. Stress fracture 2nd and 3rd metatarsals left foot x 2 months    Plan of Care:  1. Patient evaluated. X-Rays reviewed.  2. Weightbearing in CAM boot x 4 weeks. Patient has CAM boot at home.  3. Prescription for Medrol Dose Pak provided to patient.  4. Prescription for Diclofenac provided to patient.  5. Return to clinic in 4 weeks. If no improvement, we will order MRI.       Edrick Kins, DPM [facility]  Dr. Edrick Kins, DPM    [address], [place] [address]                Office [phone]  Fax [phone]

## 2018-02-14 ENCOUNTER — Telehealth: Payer: Self-pay | Admitting: Physician Assistant

## 2018-02-14 DIAGNOSIS — N529 Male erectile dysfunction, unspecified: Secondary | ICD-10-CM

## 2018-02-14 NOTE — Telephone Encounter (Signed)
Pt would like a refill for sildenafil sent to [address].

## 2018-02-15 MED ORDER — SILDENAFIL CITRATE 100 MG PO TABS: ORAL_TABLET | ORAL | 0 refills | Status: DC

## 2018-02-15 NOTE — Telephone Encounter (Signed)
Sildenafil sent.

## 2018-03-08 ENCOUNTER — Encounter: Payer: BLUE CROSS/BLUE SHIELD | Admitting: Podiatry

## 2018-03-15 NOTE — Progress Notes (Signed)
This encounter was created in error - please disregard.

## 2018-05-01 ENCOUNTER — Other Ambulatory Visit: Payer: Self-pay | Admitting: Unknown Physician Specialty

## 2018-05-01 NOTE — Telephone Encounter (Signed)
Requested Prescriptions  Pending Prescriptions Disp Refills  . atorvastatin (LIPITOR) 20 MG tablet [Pharmacy Med Name: ATORVASTATIN 20 MG TABLET] 90 tablet 0    Sig: TAKE 1 TABLET BY MOUTH EVERY DAY     Cardiovascular:  Antilipid - Statins Failed - 05/01/2018  4:38 AM      Failed - Total Cholesterol in normal range and within 360 days    Cholesterol, Total  Date Value Ref Range Status  10/30/2017 278 (H) 100 - 199 mg/dL Final         Failed - LDL in normal range and within 360 days    LDL Calculated  Date Value Ref Range Status  10/30/2017 190 (H) 0 - 99 mg/dL Final         Failed - Triglycerides in normal range and within 360 days    Triglycerides  Date Value Ref Range Status  10/30/2017 205 (H) 0 - 149 mg/dL Final         Passed - HDL in normal range and within 360 days    HDL  Date Value Ref Range Status  10/30/2017 47 >39 mg/dL Final         Passed - Patient is not pregnant      Passed - Valid encounter within last 12 months    Recent Outpatient Visits          6 months ago Annual physical exam   [facility] Kathrine Haddock, NP   7 months ago Rectal itching   [facility] Kathrine Haddock, NP      Future Appointments            In 2 days Kathrine Haddock, NP [facility], [place]

## 2018-05-03 ENCOUNTER — Ambulatory Visit: Payer: BLUE CROSS/BLUE SHIELD | Admitting: Unknown Physician Specialty

## 2018-05-06 ENCOUNTER — Other Ambulatory Visit: Payer: Self-pay | Admitting: Unknown Physician Specialty

## 2018-05-06 NOTE — Telephone Encounter (Signed)
TC to patient. Stated he did not need a refill at this time and that he forgot his last scheduled appointment on 05/03/18.Schedules 05/09/18 @9 :00a appointment. Requested Prescriptions  Pending Prescriptions Disp Refills  . levothyroxine (SYNTHROID, LEVOTHROID) 25 MCG tablet [Pharmacy Med Name: LEVOTHYROXINE 25 MCG TABLET] 90 tablet 1    Sig: TAKE 1 TABLET BY MOUTH EVERY DAY     Endocrinology:  Hypothyroid Agents Failed - 05/06/2018  2:17 AM      Failed - TSH needs to be rechecked within 3 months after an abnormal result. Refill until TSH is due.      Failed - TSH in normal range and within 360 days    TSH  Date Value Ref Range Status  10/30/2017 4.790 (H) 0.450 - 4.500 uIU/mL Final         Passed - Valid encounter within last 12 months    Recent Outpatient Visits          6 months ago Annual physical exam   [facility] Kathrine Haddock, NP   7 months ago Rectal itching   [facility] Kathrine Haddock, NP      Future Appointments            In 3 days Cannady, Barbaraann Faster, NP [facility]

## 2018-05-09 ENCOUNTER — Encounter: Payer: Self-pay | Admitting: Nurse Practitioner

## 2018-05-09 ENCOUNTER — Ambulatory Visit (INDEPENDENT_AMBULATORY_CARE_PROVIDER_SITE_OTHER): Payer: BLUE CROSS/BLUE SHIELD | Admitting: Nurse Practitioner

## 2018-05-09 VITALS — BP 129/82 | HR 70 | Temp 98.6°F | Ht 69.0 in | Wt 202.4 lb

## 2018-05-09 DIAGNOSIS — E7800 Pure hypercholesterolemia, unspecified: Secondary | ICD-10-CM | POA: Diagnosis not present

## 2018-05-09 DIAGNOSIS — F411 Generalized anxiety disorder: Secondary | ICD-10-CM | POA: Diagnosis not present

## 2018-05-09 DIAGNOSIS — E039 Hypothyroidism, unspecified: Secondary | ICD-10-CM | POA: Diagnosis not present

## 2018-05-09 DIAGNOSIS — F322 Major depressive disorder, single episode, severe without psychotic features: Secondary | ICD-10-CM

## 2018-05-09 MED ORDER — BUSPIRONE HCL 5 MG PO TABS: 5.0000 mg | ORAL_TABLET | ORAL | 3 refills | Status: DC | PRN

## 2018-05-09 MED ORDER — ROSUVASTATIN CALCIUM 5 MG PO TABS: 5.0000 mg | ORAL_TABLET | ORAL | 3 refills | Status: DC

## 2018-05-09 NOTE — Patient Instructions (Addendum)
Rosuvastatin 5MG  at night every other day and Buspar 5MG  three times a day as needed. Preventing High Cholesterol Cholesterol is a waxy, fat-like substance that your body needs in small amounts. Your liver makes all the cholesterol that your body needs. Having high cholesterol (hypercholesterolemia) increases your risk for heart disease and stroke. Extra (excess) cholesterol comes from the food you eat, such as animal-based fat (saturated fat) from meat and some dairy products. High cholesterol can often be prevented with diet and lifestyle changes. If you already have high cholesterol, you can control it with diet and lifestyle changes, as well as medicine. What nutrition changes can be made?  Eat less saturated fat. Foods that contain saturated fat include red meat and some dairy products.  Avoid processed meats, like bacon and lunch meats.  Avoid trans fats, which are found in margarine and some baked goods.  Avoid foods and beverages that have added sugars.  Eat more fruits, vegetables, and whole grains.  Choose healthy sources of protein, such as fish, poultry, and nuts.  Choose healthy sources of fat, such as: ? Nuts. ? Vegetable oils, especially olive oil. ? Fish that have healthy fats (omega-3 fatty acids), such as mackerel or salmon. What lifestyle changes can be made?  Lose weight if you are overweight. Losing 5-10 lb (2.3-4.5 kg) can help prevent or control high cholesterol and reduce your risk for diabetes and high blood pressure. Ask your health care provider to help you with a diet and exercise plan to safely lose weight.  Get enough exercise. Do at least 150 minutes of moderate-intensity exercise each week. ? You could do this in short exercise sessions several times a day, or you could do longer exercise sessions a few times a week. For example, you could take a brisk 10-minute walk or bike ride, 3 times a day, for 5 days a week.  Do not smoke. If you need help quitting,  ask your health care provider.  Limit your alcohol intake. If you drink alcohol, limit alcohol intake to no more than 1 drink a day for nonpregnant women and 2 drinks a day for men. One drink equals 12 oz of beer, 5 oz of wine, or 1 oz of hard liquor. Why are these changes important? If you have high cholesterol, deposits (plaques) may build up on the walls of your blood vessels. Plaques make the arteries narrower and stiffer, which can restrict or block blood flow and cause blood clots to form. This greatly increases your risk for heart attack and stroke. Making diet and lifestyle changes can reduce your risk for these life-threatening conditions. What can I do to lower my risk?  Manage your risk factors for high cholesterol. Talk with your health care provider about all of your risk factors and how to lower your risk.  Manage other conditions that you have, such as diabetes or high blood pressure (hypertension).  Have your cholesterol checked at regular intervals.  Keep all follow-up visits as told by your health care provider. This is important. How is this treated? In addition to diet and lifestyle changes, your health care provider may recommend medicines to help lower cholesterol, such as a medicine to reduce the amount of cholesterol made in your liver. You may need medicine if:  Diet and lifestyle changes do not lower your cholesterol enough.  You have high cholesterol and other risk factors for heart disease or stroke.  Take over-the-counter and prescription medicines only as told by your health care  provider. Where to find more information:  American Heart Association: ThisTune.com.pt.jsp  National Heart, Lung, and Blood Institute: FrenchToiletries.com.cy Summary  High cholesterol increases your risk for heart disease and stroke. By keeping your cholesterol level  low, you can reduce your risk for these conditions.  Diet and lifestyle changes are the most important steps in preventing high cholesterol.  Work with your health care provider to manage your risk factors, and have your blood tested regularly. This information is not intended to replace advice given to you by your health care provider. Make sure you discuss any questions you have with your health care provider. Document Released: 07/11/2015 Document Revised: 03/04/2016 Document Reviewed: 03/04/2016 Elsevier Interactive Patient Education  2018 [employer].  Hypothyroidism Hypothyroidism is a disorder of the thyroid. The thyroid is a large gland that is located in the lower front of the neck. The thyroid releases hormones that control how the body works. With hypothyroidism, the thyroid does not make enough of these hormones. What are the causes? Causes of hypothyroidism may include:  Viral infections.  Pregnancy.  Your own defense system (immune system) attacking your thyroid.  Certain medicines.  Birth defects.  Past radiation treatments to your head or neck.  Past treatment with radioactive iodine.  Past surgical removal of part or all of your thyroid.  Problems with the gland that is located in the center of your brain (pituitary).  What are the signs or symptoms? Signs and symptoms of hypothyroidism may include:  Feeling as though you have no energy (lethargy).  Inability to tolerate cold.  Weight gain that is not explained by a change in diet or exercise habits.  Dry skin.  Coarse hair.  Menstrual irregularity.  Slowing of thought processes.  Constipation.  Sadness or depression.  How is this diagnosed? Your health care provider may diagnose hypothyroidism with blood tests and ultrasound tests. How is this treated? Hypothyroidism is treated with medicine that replaces the hormones that your body does not make. After you begin treatment, it may take  several weeks for symptoms to go away. Follow these instructions at home:  Take medicines only as directed by your health care provider.  If you start taking any new medicines, tell your health care provider.  Keep all follow-up visits as directed by your health care provider. This is important. As your condition improves, your dosage needs may change. You will need to have blood tests regularly so that your health care provider can watch your condition. Contact a health care provider if:  Your symptoms do not get better with treatment.  You are taking thyroid replacement medicine and: ? You sweat excessively. ? You have tremors. ? You feel anxious. ? You lose weight rapidly. ? You cannot tolerate heat. ? You have emotional swings. ? You have diarrhea. ? You feel weak. Get help right away if:  You develop chest pain.  You develop an irregular heartbeat.  You develop a rapid heartbeat. This information is not intended to replace advice given to you by your health care provider. Make sure you discuss any questions you have with your health care provider. Document Released: 06/26/2005 Document Revised: 12/02/2015 Document Reviewed: 11/11/2013 Elsevier Interactive Patient Education  2018 [employer].

## 2018-05-09 NOTE — Assessment & Plan Note (Addendum)
Not taking Atorvastatin d/t side effects.  Will switch to Rosuvastatin 5MG  every other day and focus on diet.  Lipid panel today.  Recheck 6 weeks.

## 2018-05-09 NOTE — Progress Notes (Signed)
BP 129/82   Pulse 70   Temp 98.6 F (37 C) (Oral)   Ht 5\' 9"  (1.753 m)   Wt 202 lb 6.4 oz (91.8 kg)   SpO2 98%   BMI 29.89 kg/m    Subjective:    Patient ID: Sean Campbell, male    DOB: 09/24/1970, 47 y.o.   MRN: [id number]  HPI: Sean Campbell is a 47 y.o. male  Chief Complaint  Patient presents with  . Anxiety  . Hyperlipidemia  . Hypothyroidism   HYPOTHYROIDISM Reports he has not been taking Levothyroxine consistently since starting.  Started taking it every day over past three days.  Educated him on medication and need for consistency with taking to regulate thyroid. Thyroid control status:stable Satisfied with current treatment? no Medication side effects: no Medication compliance: fair compliance Etiology of hypothyroidism:  Recent dose adjustment:no Fatigue: no Cold intolerance: no Heat intolerance: no Weight gain: no Weight loss: no Constipation: no Diarrhea/loose stools: no Palpitations: no Lower extremity edema: no Anxiety/depressed mood: no   HYPERLIPIDEMIA Does not like Atorvastatin, reports headaches and occasional muscle pain with daily use.  Discussed trying alternate statin and going every other day, which he agrees with.   His wife and him are also focusing on diet and he hopes to not have to take medicine in future months. Hyperlipidemia status: poor compliance, does not like Atorvastatin is causing headaches and muscle aches on occasion Satisfied with current treatment?  no Side effects: yes, muscle aches on occasion and headaches Medication compliance: poor compliance Past cholesterol meds: atorvastain (lipitor) Supplements: none Aspirin:  no The 10-year ASCVD risk score [name], et al., 2013) is: 5.5%   Values used to calculate the score:     Age: 71 years     Sex: Male     Is Non-Hispanic African American: No     Diabetic: No     Tobacco smoker: No     Systolic Blood Pressure: 287 mmHg     Is BP treated: No     HDL Cholesterol: 47  mg/dL     Total Cholesterol: 278 mg/dL Chest pain:  no Coronary artery disease:  no Family history CAD:  yes Family history early CAD:  no   ANXIETY/STRESS Does not like Citalopram.  Reports it is "taking too long to help".  Discussed that daily medicine takes a good 4-6 weeks, but once regulated it is beneficial and discussed research on medications.  He has been taking his wife's Xanax, which he prefers.  Educated on risks/benefits of medication and discussed need for psych consult if he is unable to take maintenance meds.  Discussed option of Buspar as needed and educated him on this medication.  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  Would like to try Buspar as needed. Duration:uncontrolled Anxious mood: yes  Excessive worrying: yes Irritability: no  Sweating: no Nausea: no Palpitations:no Hyperventilation: no Panic attacks: no Agoraphobia: no  Obscessions/compulsions: no Depressed mood: no Depression screen [facility] 2/9 05/09/2018 10/30/2017 09/28/2017  Decreased Interest 0 0 3  Down, Depressed, Hopeless 0 0 2  PHQ - 2 Score 0 0 5  Altered sleeping 3 1 3   Tired, decreased energy 3 1 3   Change in appetite 0 0 3  Feeling bad or failure about yourself  0 0 1  Trouble concentrating 0 0 3  Moving slowly or fidgety/restless 0 0 2  Suicidal thoughts 0 0 1  PHQ-9 Score 6 2 21    GAD  7 : Generalized Anxiety Score 05/09/2018  Nervous, Anxious, on Edge 3  Control/stop worrying 1  Worry too much - different things 1  Trouble relaxing 3  Restless 1  Easily annoyed or irritable 1  Afraid - awful might happen 0  Total GAD 7 Score 10  Anxiety Difficulty Not difficult at all   Anhedonia: no Weight changes: no Insomnia: no hard to fall asleep  Hypersomnia: no Fatigue/loss of energy: no Feelings of worthlessness: no Feelings of guilt: no Impaired concentration/indecisiveness: no Suicidal ideations: no  Crying spells: no Recent Stressors/Life Changes: yes    Relationship problems: no   Family stress: no     Financial stress: no    Job stress: yes    Recent death/loss: no  Relevant past medical, surgical, family and social history reviewed and updated as indicated. Interim medical history since our last visit reviewed.  Allergies and medications reviewed and updated.  Review of Systems  Constitutional: Negative for activity change, appetite change, diaphoresis, fatigue and fever.  Respiratory: Negative for cough, chest tightness, shortness of breath and wheezing.   Cardiovascular: Negative for chest pain, palpitations and leg swelling.  Gastrointestinal: Negative for abdominal distention, abdominal pain, constipation, diarrhea, nausea and vomiting.  Endocrine: Negative for cold intolerance, heat intolerance, polydipsia, polyphagia and polyuria.  Musculoskeletal: Negative for arthralgias, back pain and myalgias.  Skin: Negative.   Neurological: Negative for tremors, syncope, weakness, light-headedness, numbness and headaches.  Psychiatric/Behavioral: Negative for behavioral problems, confusion and decreased concentration. The patient is nervous/anxious.     Per HPI unless specifically indicated above     Objective:    BP 129/82   Pulse 70   Temp 98.6 F (37 C) (Oral)   Ht 5\' 9"  (1.753 m)   Wt 202 lb 6.4 oz (91.8 kg)   SpO2 98%   BMI 29.89 kg/m   Wt Readings from Last 3 Encounters:  05/09/18 202 lb 6.4 oz (91.8 kg)  01/02/18 195 lb (88.5 kg)  10/30/17 211 lb 9.6 oz (96 kg)    Physical Exam  Constitutional: He is oriented to person, place, and time. He appears well-developed and well-nourished.  HENT:  Head: Normocephalic and atraumatic.  Eyes: Pupils are equal, round, and reactive to light. Conjunctivae and EOM are normal.  Neck: Normal range of motion. Neck supple. No JVD present. Carotid bruit is not present. No thyromegaly present.  Cardiovascular: Normal rate, regular rhythm and normal heart sounds. Exam reveals no gallop.    No murmur heard. Pulmonary/Chest: Effort normal and breath sounds normal.  Abdominal: Soft. Bowel sounds are normal.  Musculoskeletal: Normal range of motion.  Lymphadenopathy:    He has no cervical adenopathy.  Neurological: He is alert and oriented to person, place, and time. He has normal reflexes.  Reflex Scores:      Brachioradialis reflexes are 2+ on the right side and 2+ on the left side.      Patellar reflexes are 2+ on the right side and 2+ on the left side. Skin: Skin is warm and dry. No rash noted.  Psychiatric: He has a normal mood and affect. His behavior is normal.    Results for orders placed or performed during the hospital encounter of 01/02/18  Surgical pathology  Result Value Ref Range   SURGICAL PATHOLOGY      Surgical Pathology CASE: [id number] PATIENT: Sean Campbell Surgical Pathology Report     SPECIMEN SUBMITTED: A. Colon polyps, sigmoid; cbx and cold snare  CLINICAL HISTORY: None provided  PRE-OPERATIVE  DIAGNOSIS: Routine colonoscopy screening Z12.11; FHX colon cancer Z80.0  POST-OPERATIVE DIAGNOSIS: Colon polyps, diverticulosis     DIAGNOSIS: A.  COLON POLYPS, SIGMOID; COLD BIOPSY AND COLD SNARE: - HYPERPLASTIC EPITHELIAL CHANGE (3 FRAGMENTS). - NEGATIVE FOR DYSPLASIA AND MALIGNANCY.   GROSS DESCRIPTION: A. Labeled: Sigmoid colon polyp cbx/cold snare Received: In formalin Tissue fragment(s): 3 Size: 0.3 cm Description: Pink fragments Entirely submitted in one cassette.     Final Diagnosis performed by Delorse Lek, MD.   Electronically signed 01/03/2018 10:13:40AM The electronic signature indicates that the named Attending Pathologist has evaluated the specimen  Technical component performed at [facility], [address], [place], [place] [address] Lab : [phone] Dir: Rush Farmer, MD, MMM  Professional component performed at [facility], [facility], [place], [place], [place] [address] Lab: [phone]  Dir: Dellia Nims. Reuel Derby, MD       Assessment & Plan:   Problem List Items Addressed This Visit      Endocrine   Hypothyroid - Primary    Has not been taking medication consistently, expect minimal change in TSH.  Lab drawn today and will continue current dose.  Recheck in 6 weeks.      Relevant Orders   TSH     Other   Hyperlipidemia    Not taking Atorvastatin d/t side effects.  Will switch to Rosuvastatin 5MG  every other day and focus on diet.  Lipid panel today.  Recheck 6 weeks.      Relevant Medications   rosuvastatin (CRESTOR) 5 MG tablet   Other Relevant Orders   Lipid Profile   Depression, major, single episode, severe ([place])    Not taking Citalopram.  Continues to have anxiety element.  Buspar 5MG  PO TID as needed.  Encouraged not to take his wife's Xanax.      Relevant Medications   busPIRone (BUSPAR) 5 MG tablet   Generalized anxiety disorder    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  Encouraged not to take wife's Xanax.      Relevant Medications   busPIRone (BUSPAR) 5 MG tablet       Follow up plan: Return in about 6 weeks (around 06/20/2018) for HLD and TSH (labs needed).

## 2018-05-09 NOTE — Assessment & Plan Note (Addendum)
Has not been taking medication consistently, expect minimal change in TSH.  Lab drawn today and will continue current dose.  Recheck in 6 weeks.

## 2018-05-09 NOTE — Assessment & Plan Note (Signed)
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  Encouraged not to take wife's Xanax.

## 2018-05-09 NOTE — Assessment & Plan Note (Addendum)
Not taking Citalopram.  Continues to have anxiety element.  Buspar 5MG  PO TID as needed.  Encouraged not to take his wife's Xanax.

## 2018-05-10 ENCOUNTER — Other Ambulatory Visit: Payer: Self-pay

## 2018-05-10 DIAGNOSIS — N529 Male erectile dysfunction, unspecified: Secondary | ICD-10-CM

## 2018-05-10 LAB — LIPID PANEL
Chol/HDL Ratio: 5.6 ratio — ABNORMAL HIGH (ref 0.0–5.0)
Cholesterol, Total: 229 mg/dL — ABNORMAL HIGH (ref 100–199)
HDL: 41 mg/dL (ref >39)
LDL CALC: 165 mg/dL — AB (ref 0–99)
TRIGLYCERIDES: 114 mg/dL (ref 0–149)
VLDL Cholesterol Cal: 23 mg/dL (ref 5–40)

## 2018-05-10 LAB — TSH: TSH: 2.32 u[IU]/mL (ref 0.450–4.500)

## 2018-05-10 MED ORDER — SILDENAFIL CITRATE 100 MG PO TABS: ORAL_TABLET | ORAL | 0 refills | Status: DC

## 2018-05-10 NOTE — Telephone Encounter (Signed)
Patient requesting a refill 

## 2018-05-10 NOTE — Telephone Encounter (Signed)
Refill request approved

## 2018-06-14 ENCOUNTER — Ambulatory Visit: Payer: BLUE CROSS/BLUE SHIELD | Admitting: Nurse Practitioner

## 2018-07-15 ENCOUNTER — Ambulatory Visit (INDEPENDENT_AMBULATORY_CARE_PROVIDER_SITE_OTHER): Payer: BLUE CROSS/BLUE SHIELD | Admitting: Nurse Practitioner

## 2018-07-15 ENCOUNTER — Encounter: Payer: Self-pay | Admitting: Nurse Practitioner

## 2018-07-15 ENCOUNTER — Other Ambulatory Visit: Payer: Self-pay

## 2018-07-15 VITALS — BP 114/76 | HR 80 | Temp 98.5°F | Ht 70.0 in | Wt 198.0 lb

## 2018-07-15 DIAGNOSIS — E039 Hypothyroidism, unspecified: Secondary | ICD-10-CM

## 2018-07-15 DIAGNOSIS — E7800 Pure hypercholesterolemia, unspecified: Secondary | ICD-10-CM

## 2018-07-15 DIAGNOSIS — Z23 Encounter for immunization: Secondary | ICD-10-CM

## 2018-07-15 NOTE — Progress Notes (Signed)
BP 114/76   Pulse 80   Temp 98.5 F (36.9 C) (Oral)   Ht 5\' 10"  (1.778 m)   Wt 198 lb (89.8 kg)   SpO2 97%   BMI 28.41 kg/m    Subjective:    Patient ID: Sean Campbell, male    DOB: [birth year], 48 y.o.   MRN: [id number]  HPI: Sean Campbell is a 48 y.o. male presents for f/u visit  Chief Complaint  Patient presents with  . Hyperlipidemia    f/u  . Hypothyroidism   HYPERLIPIDEMIA Was switched from Atorvastatin to Rosuvastatin at last visit and is taking this every other day with no further side effects.  States he is tolerating this regimen better.   Hyperlipidemia status: good compliance Satisfied with current treatment?  no Side effects:  no Medication compliance: good compliance Supplements: none Aspirin:  no The 10-year ASCVD risk score [name], et al., 2013) is: 3.9%   Values used to calculate the score:     Age: 48 years     Sex: Male     Is Non-Hispanic African American: No     Diabetic: No     Tobacco smoker: No     Systolic Blood Pressure: 503 mmHg     Is BP treated: No     HDL Cholesterol: 41 mg/dL     Total Cholesterol: 229 mg/dL Chest pain:  no Coronary artery disease:  no Family history CAD:  no Family history early CAD:  no   HYPOTHYROIDISM Last TSH was 2.320 in October.  He reports he continues to often forget to take doses, currently takes it 3-4 times week.   Thyroid control status:controlled Satisfied with current treatment? no Medication side effects: no Medication compliance: good compliance Etiology of hypothyroidism:  Recent dose adjustment:no Fatigue: no Cold intolerance: no Heat intolerance: no Weight gain: no Weight loss: no Constipation: no Diarrhea/loose stools: no Palpitations: no Lower extremity edema: no Anxiety/depressed mood: no  Relevant past medical, surgical, family and social history reviewed and updated as indicated. Interim medical history since our last visit reviewed. Allergies and medications reviewed and  updated.  Review of Systems  Constitutional: Negative for activity change, diaphoresis, fatigue and fever.  Respiratory: Negative for cough, chest tightness, shortness of breath and wheezing.   Cardiovascular: Negative for chest pain, palpitations and leg swelling.  Gastrointestinal: Negative for abdominal distention, abdominal pain, constipation, diarrhea, nausea and vomiting.  Endocrine: Negative for cold intolerance, heat intolerance, polydipsia, polyphagia and polyuria.  Musculoskeletal: Negative.   Skin: Negative.   Neurological: Negative for dizziness, syncope, weakness, light-headedness, numbness and headaches.  Psychiatric/Behavioral: Negative.     Per HPI unless specifically indicated above     Objective:    BP 114/76   Pulse 80   Temp 98.5 F (36.9 C) (Oral)   Ht 5\' 10"  (1.778 m)   Wt 198 lb (89.8 kg)   SpO2 97%   BMI 28.41 kg/m   Wt Readings from Last 3 Encounters:  07/15/18 198 lb (89.8 kg)  05/09/18 202 lb 6.4 oz (91.8 kg)  01/02/18 195 lb (88.5 kg)    Physical Exam Vitals signs and nursing note reviewed.  Constitutional:      Appearance: He is well-developed.  HENT:     Head: Normocephalic and atraumatic.     Right Ear: Hearing normal. No drainage.     Left Ear: Hearing normal. No drainage.     Mouth/Throat:     Pharynx: Uvula midline.  Eyes:  General: Lids are normal.        Right eye: No discharge.        Left eye: No discharge.     Conjunctiva/sclera: Conjunctivae normal.     Pupils: Pupils are equal, round, and reactive to light.  Neck:     Musculoskeletal: Normal range of motion and neck supple.     Thyroid: No thyromegaly.     Vascular: No carotid bruit or JVD.     Trachea: Trachea normal.  Cardiovascular:     Rate and Rhythm: Normal rate and regular rhythm.     Heart sounds: Normal heart sounds, S1 normal and S2 normal. No murmur. No gallop.   Pulmonary:     Effort: Pulmonary effort is normal.     Breath sounds: Normal breath sounds.    Abdominal:     General: Bowel sounds are normal.     Palpations: Abdomen is soft. There is no hepatomegaly or splenomegaly.  Musculoskeletal: Normal range of motion.  Skin:    General: Skin is warm and dry.     Capillary Refill: Capillary refill takes less than 2 seconds.     Findings: No rash.  Neurological:     Mental Status: He is alert and oriented to person, place, and time.     Deep Tendon Reflexes: Reflexes are normal and symmetric.  Psychiatric:        Mood and Affect: Mood normal.        Behavior: Behavior normal.        Thought Content: Thought content normal.        Judgment: Judgment normal.     Results for orders placed or performed in visit on 05/09/18  TSH  Result Value Ref Range   TSH 2.320 0.450 - 4.500 uIU/mL  Lipid Profile  Result Value Ref Range   Cholesterol, Total 229 (H) 100 - 199 mg/dL   Triglycerides 114 0 - 149 mg/dL   HDL 41 >39 mg/dL   VLDL Cholesterol Cal 23 5 - 40 mg/dL   LDL Calculated 165 (H) 0 - 99 mg/dL   Chol/HDL Ratio 5.6 (H) 0.0 - 5.0 ratio      Assessment & Plan:   Problem List Items Addressed This Visit      Endocrine   Hypothyroid    Continues to not take medication on consistent basis, although at last visit TSH has improved.  Continue medication and check thyroid panel today.  Adjust dose as needed.      Relevant Orders   Thyroid Panel With TSH     Other   Hyperlipidemia    Has been taking Rosuvastatin without issue and tolerating well.  Will continue this and recheck lipid panel today.  Follow-up 6 months.  Adjust dose as needed.      Relevant Orders   Lipid Panel w/o Chol/HDL Ratio    Other Visit Diagnoses    Flu vaccine need    -  Primary   Relevant Orders   Flu Vaccine QUAD 36+ mos IM (Completed)       Follow up plan: Return in about 6 months (around 01/13/2019) for HLD and TSH.

## 2018-07-15 NOTE — Assessment & Plan Note (Signed)
Continues to not take medication on consistent basis, although at last visit TSH has improved.  Continue medication and check thyroid panel today.  Adjust dose as needed.

## 2018-07-15 NOTE — Patient Instructions (Signed)
Fat and Cholesterol Restricted Eating Plan Getting too much fat and cholesterol in your diet may cause health problems. Choosing the right foods helps keep your fat and cholesterol at normal levels. This can keep you from getting certain diseases. Your doctor may recommend an eating plan that includes:  Total fat: ______% or less of total calories a day.  Saturated fat: ______% or less of total calories a day.  Cholesterol: less than _________mg a day.  Fiber: ______g a day. What are tips for following this plan? Meal planning  At meals, divide your plate into four equal parts: ? Fill one-half of your plate with vegetables and green salads. ? Fill one-fourth of your plate with whole grains. ? Fill one-fourth of your plate with low-fat (lean) protein foods.  Eat fish that is high in omega-3 fats at least two times a week. This includes mackerel, tuna, sardines, and salmon.  Eat foods that are high in fiber, such as whole grains, beans, apples, broccoli, carrots, peas, and barley. General tips   Work with your doctor to lose weight if you need to.  Avoid: ? Foods with added sugar. ? Fried foods. ? Foods with partially hydrogenated oils.  Limit alcohol intake to no more than 1 drink a day for nonpregnant women and 2 drinks a day for men. One drink equals 12 oz of beer, 5 oz of wine, or 1 oz of hard liquor. Reading food labels  Check food labels for: ? Trans fats. ? Partially hydrogenated oils. ? Saturated fat (g) in each serving. ? Cholesterol (mg) in each serving. ? Fiber (g) in each serving.  Choose foods with healthy fats, such as: ? Monounsaturated fats. ? Polyunsaturated fats. ? Omega-3 fats.  Choose grain products that have whole grains. Look for the word "whole" as the first word in the ingredient list. Cooking  Cook foods using low-fat methods. These include baking, boiling, grilling, and broiling.  Eat more home-cooked foods. Eat at restaurants and buffets  less often.  Avoid cooking using saturated fats, such as butter, cream, palm oil, palm kernel oil, and coconut oil. Recommended foods  Fruits  All fresh, canned (in natural juice), or frozen fruits. Vegetables  Fresh or frozen vegetables (raw, steamed, roasted, or grilled). Green salads. Grains  Whole grains, such as whole wheat or whole grain breads, crackers, cereals, and pasta. Unsweetened oatmeal, bulgur, barley, quinoa, or brown rice. Corn or whole wheat flour tortillas. Meats and other protein foods  Ground beef (85% or leaner), grass-fed beef, or beef trimmed of fat. Skinless chicken or turkey. Ground chicken or turkey. Pork trimmed of fat. All fish and seafood. Egg whites. Dried beans, peas, or lentils. Unsalted nuts or seeds. Unsalted canned beans. Nut butters without added sugar or oil. Dairy  Low-fat or nonfat dairy products, such as skim or 1% milk, 2% or reduced-fat cheeses, low-fat and fat-free ricotta or cottage cheese, or plain low-fat and nonfat yogurt. Fats and oils  Tub margarine without trans fats. Light or reduced-fat mayonnaise and salad dressings. Avocado. Olive, canola, sesame, or safflower oils. The items listed above may not be a complete list of foods and beverages you can eat. Contact a dietitian for more information. Foods to avoid Fruits  Canned fruit in heavy syrup. Fruit in cream or butter sauce. Fried fruit. Vegetables  Vegetables cooked in cheese, cream, or butter sauce. Fried vegetables. Grains  White bread. White pasta. White rice. Cornbread. Bagels, pastries, and croissants. Crackers and snack foods that contain trans fat   and hydrogenated oils. Meats and other protein foods  Fatty cuts of meat. Ribs, chicken wings, bacon, sausage, bologna, salami, chitterlings, fatback, hot dogs, bratwurst, and packaged lunch meats. Liver and organ meats. Whole eggs and egg yolks. Chicken and turkey with skin. Fried meat. Dairy  Whole or 2% milk, cream,  half-and-half, and cream cheese. Whole milk cheeses. Whole-fat or sweetened yogurt. Full-fat cheeses. Nondairy creamers and whipped toppings. Processed cheese, cheese spreads, and cheese curds. Beverages  Alcohol. Sugar-sweetened drinks such as sodas, lemonade, and fruit drinks. Fats and oils  Butter, stick margarine, lard, shortening, ghee, or bacon fat. Coconut, palm kernel, and palm oils. Sweets and desserts  Corn syrup, sugars, honey, and molasses. Candy. Jam and jelly. Syrup. Sweetened cereals. Cookies, pies, cakes, donuts, muffins, and ice cream. The items listed above may not be a complete list of foods and beverages you should avoid. Contact a dietitian for more information. Summary  Choosing the right foods helps keep your fat and cholesterol at normal levels. This can keep you from getting certain diseases.  At meals, fill one-half of your plate with vegetables and green salads.  Eat high-fiber foods, like whole grains, beans, apples, carrots, peas, and barley.  Limit added sugar, saturated fats, alcohol, and fried foods. This information is not intended to replace advice given to you by your health care provider. Make sure you discuss any questions you have with your health care provider. Document Released: 12/26/2011 Document Revised: 02/27/2018 Document Reviewed: 03/13/2017 Elsevier Interactive Patient Education  2019 [employer] Inc.   

## 2018-07-15 NOTE — Assessment & Plan Note (Signed)
Has been taking Rosuvastatin without issue and tolerating well.  Will continue this and recheck lipid panel today.  Follow-up 6 months.  Adjust dose as needed.

## 2018-07-16 LAB — THYROID PANEL WITH TSH
Free Thyroxine Index: 1.7 (ref 1.2–4.9)
T3 Uptake Ratio: 25 % (ref 24–39)
T4, Total: 6.9 ug/dL (ref 4.5–12.0)
TSH: 3.05 u[IU]/mL (ref 0.450–4.500)

## 2018-07-16 LAB — LIPID PANEL W/O CHOL/HDL RATIO
Cholesterol, Total: 184 mg/dL (ref 100–199)
HDL: 39 mg/dL — ABNORMAL LOW (ref >39)
LDL Calculated: 122 mg/dL — ABNORMAL HIGH (ref 0–99)
Triglycerides: 113 mg/dL (ref 0–149)
VLDL Cholesterol Cal: 23 mg/dL (ref 5–40)

## 2018-08-14 ENCOUNTER — Other Ambulatory Visit: Payer: Self-pay

## 2018-08-14 ENCOUNTER — Encounter: Payer: Self-pay | Admitting: Nurse Practitioner

## 2018-08-14 ENCOUNTER — Ambulatory Visit (INDEPENDENT_AMBULATORY_CARE_PROVIDER_SITE_OTHER): Payer: BLUE CROSS/BLUE SHIELD | Admitting: Nurse Practitioner

## 2018-08-14 VITALS — BP 118/79 | HR 84 | Temp 98.2°F | Ht 70.0 in | Wt 200.0 lb

## 2018-08-14 DIAGNOSIS — R52 Pain, unspecified: Secondary | ICD-10-CM | POA: Diagnosis not present

## 2018-08-14 DIAGNOSIS — K529 Noninfective gastroenteritis and colitis, unspecified: Secondary | ICD-10-CM | POA: Diagnosis not present

## 2018-08-14 DIAGNOSIS — F409 Phobic anxiety disorder, unspecified: Secondary | ICD-10-CM | POA: Insufficient documentation

## 2018-08-14 DIAGNOSIS — F408 Other phobic anxiety disorders: Secondary | ICD-10-CM | POA: Diagnosis not present

## 2018-08-14 LAB — VERITOR FLU A/B WAIVED
Influenza A: NEGATIVE
Influenza B: NEGATIVE

## 2018-08-14 MED ORDER — DIAZEPAM 5 MG PO TABS: 5.0000 mg | ORAL_TABLET | Freq: Two times a day (BID) | ORAL | 0 refills | Status: DC | PRN

## 2018-08-14 NOTE — Assessment & Plan Note (Signed)
Negative flu testing.  Recommend bland diet at home.  Good hand washing and Lysol wipe at home to avoid spreading.  Tylenol for myalgia as needed.  Avoid Imodium.  Return for worsening or continued symptoms.

## 2018-08-14 NOTE — Progress Notes (Signed)
BP 118/79   Pulse 84   Temp 98.2 F (36.8 C) (Oral)   Ht 5\' 10"  (1.778 m)   Wt 200 lb (90.7 kg)   SpO2 97%   BMI 28.70 kg/m    Subjective:    Patient ID: Sean Campbell, male    DOB: [birth year], 48 y.o.   MRN: [id number]  HPI: Sean Campbell is a 48 y.o. male  Chief Complaint  Patient presents with  . Headache    pt states symptoms statarted yesterday/chest congestion/ pt states he has being around sick people  . Generalized Body Aches  . Nausea   GI SYMPTOMS He reports his son has been sick with gastritis and his grand daughter has "stomach bug" too.  They have not seen provider, flu testing unknown.   Started feeling bad yesterday with nausea (no emesis) and diarrhea.   Worst symptom: nausea Fever: no Cough: yes Shortness of breath: no Wheezing: no Chest pain: yes, with cough Chest tightness: no Chest congestion: no Nasal congestion: yes Runny nose: no Post nasal drip: no Sneezing: no Sore throat: yes Swollen glands: no Sinus pressure: no Headache: yes Face pain: no Toothache: yes, has toothache and is going to dentist tomorrow Ear pain: none Ear pressure: none Eyes red/itching:no Eye drainage/crusting: no  Vomiting: yes Rash: no Fatigue: yes Sick contacts: yes Strep contacts: no  Context: fluctuating Recurrent sinusitis: no Relief with OTC cold/cough medications: no  Treatments attempted: none   DENTAL PROCEDURE TOMORROW: Has extreme anxiety with dentist & has tooth extraction tomorrow.  Request Diazepam refill due to fear of dentist.  Has used this in past, last February 2019, and it helped with symptoms.    Relevant past medical, surgical, family and social history reviewed and updated as indicated. Interim medical history since our last visit reviewed. Allergies and medications reviewed and updated.  Review of Systems  Constitutional: Positive for fatigue. Negative for activity change, diaphoresis and fever.  HENT: Positive for congestion, postnasal  drip and rhinorrhea. Negative for ear discharge, ear pain, sinus pressure, sinus pain, sneezing, sore throat and voice change.   Respiratory: Negative for cough, chest tightness, shortness of breath and wheezing.   Cardiovascular: Negative for chest pain, palpitations and leg swelling.  Gastrointestinal: Positive for diarrhea and nausea. Negative for abdominal distention, abdominal pain, constipation and vomiting.  Endocrine: Negative for cold intolerance, heat intolerance, polydipsia, polyphagia and polyuria.  Musculoskeletal: Positive for myalgias.  Skin: Negative.   Neurological: Negative for dizziness, syncope, weakness, light-headedness, numbness and headaches.  Psychiatric/Behavioral: Negative.     Per HPI unless specifically indicated above     Objective:    BP 118/79   Pulse 84   Temp 98.2 F (36.8 C) (Oral)   Ht 5\' 10"  (1.778 m)   Wt 200 lb (90.7 kg)   SpO2 97%   BMI 28.70 kg/m   Wt Readings from Last 3 Encounters:  08/14/18 200 lb (90.7 kg)  07/15/18 198 lb (89.8 kg)  05/09/18 202 lb 6.4 oz (91.8 kg)    Physical Exam Vitals signs and nursing note reviewed.  Constitutional:      Appearance: He is well-developed.  HENT:     Head: Normocephalic and atraumatic.     Right Ear: Hearing normal. No drainage.     Left Ear: Hearing normal. No drainage.     Mouth/Throat:     Pharynx: Uvula midline.  Eyes:     General: Lids are normal.        Right eye:  No discharge.        Left eye: No discharge.     Conjunctiva/sclera: Conjunctivae normal.     Pupils: Pupils are equal, round, and reactive to light.  Neck:     Musculoskeletal: Normal range of motion and neck supple.     Thyroid: No thyromegaly.     Vascular: No carotid bruit or JVD.     Trachea: Trachea normal.  Cardiovascular:     Rate and Rhythm: Normal rate and regular rhythm.     Heart sounds: Normal heart sounds, S1 normal and S2 normal. No murmur. No gallop.   Pulmonary:     Effort: Pulmonary effort is  normal.     Breath sounds: Normal breath sounds.  Abdominal:     General: Bowel sounds are normal.     Palpations: Abdomen is soft. There is no hepatomegaly or splenomegaly.     Tenderness: There is no abdominal tenderness.  Musculoskeletal: Normal range of motion.     Right lower leg: No edema.     Left lower leg: No edema.  Lymphadenopathy:     Cervical: No cervical adenopathy.  Skin:    General: Skin is warm and dry.     Capillary Refill: Capillary refill takes less than 2 seconds.     Findings: No rash.  Neurological:     Mental Status: He is alert and oriented to person, place, and time.     Deep Tendon Reflexes: Reflexes are normal and symmetric.  Psychiatric:        Mood and Affect: Mood normal.        Behavior: Behavior normal.        Thought Content: Thought content normal.        Judgment: Judgment normal.     Results for orders placed or performed in visit on 07/15/18  Lipid Panel w/o Chol/HDL Ratio  Result Value Ref Range   Cholesterol, Total 184 100 - 199 mg/dL   Triglycerides 113 0 - 149 mg/dL   HDL 39 (L) >39 mg/dL   VLDL Cholesterol Cal 23 5 - 40 mg/dL   LDL Calculated 122 (H) 0 - 99 mg/dL  Thyroid Panel With TSH  Result Value Ref Range   TSH 3.050 0.450 - 4.500 uIU/mL   T4, Total 6.9 4.5 - 12.0 ug/dL   T3 Uptake Ratio 25 24 - 39 %   Free Thyroxine Index 1.7 1.2 - 4.9      Assessment & Plan:   Problem List Items Addressed This Visit      Digestive   Gastroenteritis - Primary    Negative flu testing.  Recommend bland diet at home.  Good hand washing and Lysol wipe at home to avoid spreading.  Tylenol for myalgia as needed.  Avoid Imodium.  Return for worsening or continued symptoms.      Relevant Orders   Veritor Flu A/B Waived     Other   Fear of    Fear of dentist with upcoming procedures.  Has used Valium in past for this.  Will provider 15 tablet with no refill for upcoming procedures.         Controlled substance.  Checked data base and  no other refills of controlled substances noted, last February 2019.   Follow up plan: Return if symptoms worsen or fail to improve.

## 2018-08-14 NOTE — Assessment & Plan Note (Signed)
Fear of dentist with upcoming procedures.  Has used Valium in past for this.  Will provider 15 tablet with no refill for upcoming procedures.

## 2018-08-14 NOTE — Patient Instructions (Signed)
Viral Gastroenteritis, Adult    Viral gastroenteritis is also known as the stomach flu. This condition is caused by certain germs (viruses). These germs can be passed from person to person very easily (are very contagious). This condition can cause sudden watery poop (diarrhea), fever, and throwing up (vomiting).  Having watery poop and throwing up can make you feel weak and cause you to get dehydrated. Dehydration can make you tired and thirsty, make you have a dry mouth, and make it so you pee (urinate) less often. Older adults and people with other diseases or a weak defense system (immune system) are at higher risk for dehydration. It is important to replace the fluids that you lose from having watery poop and throwing up.  Follow these instructions at home:  Follow instructions from your doctor about how to care for yourself at home.  Eating and drinking  Follow these instructions as told by your doctor:   Take an oral rehydration solution (ORS). This is a drink that is sold at pharmacies and stores.   Drink clear fluids in small amounts as you are able, such as:  ? Water.  ? Ice chips.  ? Diluted fruit juice.  ? Low-calorie sports drinks.   Eat bland, easy-to-digest foods in small amounts as you are able, such as:  ? Bananas.  ? Applesauce.  ? Rice.  ? Low-fat (lean) meats.  ? Toast.  ? Crackers.   Avoid fluids that have a lot of sugar or caffeine in them.   Avoid alcohol.   Avoid spicy or fatty foods.  General instructions     Drink enough fluid to keep your pee (urine) clear or pale yellow.   Wash your hands often. If you cannot use soap and water, use hand sanitizer.   Make sure that all people in your home wash their hands well and often.   Rest at home while you get better.   Take over-the-counter and prescription medicines only as told by your doctor.   Watch your condition for any changes.   Take a warm bath to help with any burning or pain from having watery poop.   Keep all follow-up  visits as told by your doctor. This is important.  Contact a doctor if:   You cannot keep fluids down.   Your symptoms get worse.   You have new symptoms.   You feel light-headed or dizzy.   You have muscle cramps.  Get help right away if:   You have chest pain.   You feel very weak or you pass out (faint).   You see blood in your throw-up.   Your throw-up looks like coffee grounds.   You have bloody or black poop (stools) or poop that look like tar.   You have a very bad headache, a stiff neck, or both.   You have a rash.   You have very bad pain, cramping, or bloating in your belly (abdomen).   You have trouble breathing.   You are breathing very quickly.   Your heart is beating very quickly.   Your skin feels cold and clammy.   You feel confused.   You have pain when you pee.   You have signs of dehydration, such as:  ? Dark pee, hardly any pee, or no pee.  ? Cracked lips.  ? Dry mouth.  ? Sunken eyes.  ? Sleepiness.  ? Weakness.  This information is not intended to replace advice given to you by your   health care provider. Make sure you discuss any questions you have with your health care provider.  Document Released: 12/13/2007 Document Revised: 03/20/2018 Document Reviewed: 03/02/2015  Elsevier Interactive Patient Education  2019 [employer]vier Inc.

## 2018-11-25 DIAGNOSIS — M9903 Segmental and somatic dysfunction of lumbar region: Secondary | ICD-10-CM | POA: Diagnosis not present

## 2018-11-25 DIAGNOSIS — M531 Cervicobrachial syndrome: Secondary | ICD-10-CM | POA: Diagnosis not present

## 2018-11-25 DIAGNOSIS — M5387 Other specified dorsopathies, lumbosacral region: Secondary | ICD-10-CM | POA: Diagnosis not present

## 2018-11-25 DIAGNOSIS — M9904 Segmental and somatic dysfunction of sacral region: Secondary | ICD-10-CM | POA: Diagnosis not present

## 2018-12-03 DIAGNOSIS — Z711 Person with feared health complaint in whom no diagnosis is made: Secondary | ICD-10-CM | POA: Diagnosis not present

## 2018-12-03 DIAGNOSIS — Z20828 Contact with and (suspected) exposure to other viral communicable diseases: Secondary | ICD-10-CM | POA: Diagnosis not present

## 2018-12-03 DIAGNOSIS — Z1159 Encounter for screening for other viral diseases: Secondary | ICD-10-CM | POA: Diagnosis not present

## 2018-12-17 ENCOUNTER — Telehealth: Payer: Self-pay | Admitting: Nurse Practitioner

## 2018-12-17 ENCOUNTER — Other Ambulatory Visit: Payer: Self-pay | Admitting: Nurse Practitioner

## 2018-12-17 DIAGNOSIS — N529 Male erectile dysfunction, unspecified: Secondary | ICD-10-CM

## 2018-12-17 MED ORDER — SILDENAFIL CITRATE 100 MG PO TABS: ORAL_TABLET | ORAL | 0 refills | Status: DC

## 2018-12-17 NOTE — Telephone Encounter (Signed)
Copied from [place] [id number]. Topic: Quick Communication - Rx Refill/Question >> Dec 17, 2018 10:58 AM Sheran Luz wrote: Medication: sildenafil (VIAGRA) 100 MG tablet  Patient is requesting refill of this medication.   Preferred Pharmacy (with phone number or street name):[id number] - [place], [place] - [address] [phone] (Phone) [phone] (Fax)

## 2018-12-17 NOTE — Telephone Encounter (Signed)
complete

## 2018-12-17 NOTE — Telephone Encounter (Signed)
Called pt to let him know that medication refill has been sent to pharmacy, no answer, left voicemail.

## 2019-01-24 ENCOUNTER — Ambulatory Visit (INDEPENDENT_AMBULATORY_CARE_PROVIDER_SITE_OTHER): Payer: BC Managed Care – PPO | Admitting: Nurse Practitioner

## 2019-01-24 ENCOUNTER — Other Ambulatory Visit: Payer: Self-pay

## 2019-01-24 ENCOUNTER — Encounter: Payer: Self-pay | Admitting: Nurse Practitioner

## 2019-01-24 DIAGNOSIS — E039 Hypothyroidism, unspecified: Secondary | ICD-10-CM

## 2019-01-24 DIAGNOSIS — E7800 Pure hypercholesterolemia, unspecified: Secondary | ICD-10-CM | POA: Diagnosis not present

## 2019-01-24 DIAGNOSIS — Z87891 Personal history of nicotine dependence: Secondary | ICD-10-CM | POA: Insufficient documentation

## 2019-01-24 DIAGNOSIS — F17210 Nicotine dependence, cigarettes, uncomplicated: Secondary | ICD-10-CM | POA: Insufficient documentation

## 2019-01-24 NOTE — Patient Instructions (Signed)
Fat and Cholesterol Restricted Eating Plan Getting too much fat and cholesterol in your diet may cause health problems. Choosing the right foods helps keep your fat and cholesterol at normal levels. This can keep you from getting certain diseases. Your doctor may recommend an eating plan that includes:  Total fat: ______% or less of total calories a day.  Saturated fat: ______% or less of total calories a day.  Cholesterol: less than _________mg a day.  Fiber: ______g a day. What are tips for following this plan? Meal planning  At meals, divide your plate into four equal parts: ? Fill one-half of your plate with vegetables and green salads. ? Fill one-fourth of your plate with whole grains. ? Fill one-fourth of your plate with low-fat (lean) protein foods.  Eat fish that is high in omega-3 fats at least two times a week. This includes mackerel, tuna, sardines, and salmon.  Eat foods that are high in fiber, such as whole grains, beans, apples, broccoli, carrots, peas, and barley. General tips   Work with your doctor to lose weight if you need to.  Avoid: ? Foods with added sugar. ? Fried foods. ? Foods with partially hydrogenated oils.  Limit alcohol intake to no more than 1 drink a day for nonpregnant women and 2 drinks a day for men. One drink equals 12 oz of beer, 5 oz of wine, or 1 oz of hard liquor. Reading food labels  Check food labels for: ? Trans fats. ? Partially hydrogenated oils. ? Saturated fat (g) in each serving. ? Cholesterol (mg) in each serving. ? Fiber (g) in each serving.  Choose foods with healthy fats, such as: ? Monounsaturated fats. ? Polyunsaturated fats. ? Omega-3 fats.  Choose grain products that have whole grains. Look for the word "whole" as the first word in the ingredient list. Cooking  Cook foods using low-fat methods. These include baking, boiling, grilling, and broiling.  Eat more home-cooked foods. Eat at restaurants and buffets  less often.  Avoid cooking using saturated fats, such as butter, cream, palm oil, palm kernel oil, and coconut oil. Recommended foods  Fruits  All fresh, canned (in natural juice), or frozen fruits. Vegetables  Fresh or frozen vegetables (raw, steamed, roasted, or grilled). Green salads. Grains  Whole grains, such as whole wheat or whole grain breads, crackers, cereals, and pasta. Unsweetened oatmeal, bulgur, barley, quinoa, or brown rice. Corn or whole wheat flour tortillas. Meats and other protein foods  Ground beef (85% or leaner), grass-fed beef, or beef trimmed of fat. Skinless chicken or turkey. Ground chicken or turkey. Pork trimmed of fat. All fish and seafood. Egg whites. Dried beans, peas, or lentils. Unsalted nuts or seeds. Unsalted canned beans. Nut butters without added sugar or oil. Dairy  Low-fat or nonfat dairy products, such as skim or 1% milk, 2% or reduced-fat cheeses, low-fat and fat-free ricotta or cottage cheese, or plain low-fat and nonfat yogurt. Fats and oils  Tub margarine without trans fats. Light or reduced-fat mayonnaise and salad dressings. Avocado. Olive, canola, sesame, or safflower oils. The items listed above may not be a complete list of foods and beverages you can eat. Contact a dietitian for more information. Foods to avoid Fruits  Canned fruit in heavy syrup. Fruit in cream or butter sauce. Fried fruit. Vegetables  Vegetables cooked in cheese, cream, or butter sauce. Fried vegetables. Grains  White bread. White pasta. White rice. Cornbread. Bagels, pastries, and croissants. Crackers and snack foods that contain trans fat   and hydrogenated oils. Meats and other protein foods  Fatty cuts of meat. Ribs, chicken wings, bacon, sausage, bologna, salami, chitterlings, fatback, hot dogs, bratwurst, and packaged lunch meats. Liver and organ meats. Whole eggs and egg yolks. Chicken and turkey with skin. Fried meat. Dairy  Whole or 2% milk, cream,  half-and-half, and cream cheese. Whole milk cheeses. Whole-fat or sweetened yogurt. Full-fat cheeses. Nondairy creamers and whipped toppings. Processed cheese, cheese spreads, and cheese curds. Beverages  Alcohol. Sugar-sweetened drinks such as sodas, lemonade, and fruit drinks. Fats and oils  Butter, stick margarine, lard, shortening, ghee, or bacon fat. Coconut, palm kernel, and palm oils. Sweets and desserts  Corn syrup, sugars, honey, and molasses. Candy. Jam and jelly. Syrup. Sweetened cereals. Cookies, pies, cakes, donuts, muffins, and ice cream. The items listed above may not be a complete list of foods and beverages you should avoid. Contact a dietitian for more information. Summary  Choosing the right foods helps keep your fat and cholesterol at normal levels. This can keep you from getting certain diseases.  At meals, fill one-half of your plate with vegetables and green salads.  Eat high-fiber foods, like whole grains, beans, apples, carrots, peas, and barley.  Limit added sugar, saturated fats, alcohol, and fried foods. This information is not intended to replace advice given to you by your health care provider. Make sure you discuss any questions you have with your health care provider. Document Released: 12/26/2011 Document Revised: 02/27/2018 Document Reviewed: 03/13/2017 Elsevier Patient Education  2020 [employer] Inc.  

## 2019-01-24 NOTE — Assessment & Plan Note (Signed)
Ongoing, not taking statin.  Last ASCVD score 3.5%, acceptable to focus on diet at this time.  Will check outpatient labs and adjust plan of care as needed based on findings.  Patient wishes to focus on diet, exercise, and smoking cessation.

## 2019-01-24 NOTE — Progress Notes (Signed)
There were no vitals taken for this visit.   Subjective:    Patient ID: Sean Campbell, male    DOB: 03/25/1971, 48 y.o.   MRN: [id number]  HPI: Sean Campbell is a 48 y.o. male  Chief Complaint  Patient presents with  . Hyperlipidemia  . Hypothyroidism    . This visit was completed via WebEx due to the restrictions of the COVID-19 pandemic. All issues as above were discussed and addressed. Physical exam was done as above through visual confirmation on WebEx. If it was felt that the patient should be evaluated in the office, they were directed there. The patient verbally consented to this visit. . Location of the patient: work . Location of the provider: home . Those involved with this call:  . Provider: Marnee Guarneri, DNP . CMA: Yvonna Alanis, CMA . Front Desk/Registration: Jill Side  . Time spent on call: 15 minutes with patient face to face via video conference. More than 50% of this time was spent in counseling and coordination of care. 5 minutes total spent in review of patient's record and preparation of their chart.  . I verified patient identity using two factors (patient name and date of birth). Patient consents verbally to being seen via telemedicine visit today.    HYPERLIPIDEMIA Has not been taking statin.  Drinking pressed protein drinks and changing diet + exercise.  Did not like medication, has tried different statins and they caused muscle aches and fatigue.   Supplements: none Aspirin:  no The 10-year ASCVD risk score [name], et al., 2013) is: 3.5%   Values used to calculate the score:     Age: 81 years     Sex: Male     Is Non-Hispanic African American: No     Diabetic: No     Tobacco smoker: No     Systolic Blood Pressure: 885 mmHg     Is BP treated: No     HDL Cholesterol: 39 mg/dL     Total Cholesterol: 184 mg/dL Chest pain:  no Coronary artery disease:  no Family history CAD:  no Family history early CAD:  no   HYPOTHYROIDISM Currently not  taking thyroid medication because his thyroid labs were good on last labs without him taking his medication, so he decided he wanted to see how he did without taking medication. Thyroid control status:stable Satisfied with current treatment? yes Fatigue: no Cold intolerance: no Heat intolerance: no Weight gain: no Weight loss: no Constipation: no Diarrhea/loose stools: no Palpitations: no Lower extremity edema: no Anxiety/depressed mood: no  Relevant past medical, surgical, family and social history reviewed and updated as indicated. Interim medical history since our last visit reviewed. Allergies and medications reviewed and updated.  Review of Systems  Constitutional: Negative for activity change, diaphoresis, fatigue and fever.  Respiratory: Negative for cough, chest tightness, shortness of breath and wheezing.   Cardiovascular: Negative for chest pain, palpitations and leg swelling.  Gastrointestinal: Negative for abdominal distention, abdominal pain, constipation, diarrhea, nausea and vomiting.  Endocrine: Negative for cold intolerance and heat intolerance.  Musculoskeletal: Negative.   Skin: Negative.   Neurological: Negative for dizziness, syncope, weakness, light-headedness, numbness and headaches.  Psychiatric/Behavioral: Negative.     Per HPI unless specifically indicated above     Objective:    There were no vitals taken for this visit.  Wt Readings from Last 3 Encounters:  08/14/18 200 lb (90.7 kg)  07/15/18 198 lb (89.8 kg)  05/09/18 202 lb 6.4 oz (  91.8 kg)    Physical Exam Vitals signs and nursing note reviewed.  Constitutional:      General: He is awake. He is not in acute distress.    Appearance: He is well-developed. He is not ill-appearing.  HENT:     Head: Normocephalic.     Right Ear: Hearing normal. No drainage.     Left Ear: Hearing normal. No drainage.  Eyes:     General: Lids are normal.        Right eye: No discharge.        Left eye: No  discharge.     Conjunctiva/sclera: Conjunctivae normal.  Neck:     Musculoskeletal: Normal range of motion.  Cardiovascular:     Comments: Unable to auscultate due to virtual exam only  Pulmonary:     Effort: Pulmonary effort is normal. No accessory muscle usage or respiratory distress.     Comments: Unable to auscultate due to virtual exam only  Neurological:     Mental Status: He is alert and oriented to person, place, and time.  Psychiatric:        Mood and Affect: Mood normal.        Behavior: Behavior normal. Behavior is cooperative.        Thought Content: Thought content normal.        Judgment: Judgment normal.     Results for orders placed or performed in visit on 08/14/18  Veritor Flu A/B Waived  Result Value Ref Range   Influenza A Negative Negative   Influenza B Negative Negative      Assessment & Plan:   Problem List Items Addressed This Visit      Endocrine   Hypothyroid - Primary    Ongoing, continues to not take medication.  Wishes to have thyroid labs and if normal he wishes to maintain control without medication.  Discussed with him at length.  Outpatient labs ordered.  Initiate medication as indicated and if patient agrees.      Relevant Orders   Thyroid Panel With TSH     Other   Hyperlipidemia    Ongoing, not taking statin.  Last ASCVD score 3.5%, acceptable to focus on diet at this time.  Will check outpatient labs and adjust plan of care as needed based on findings.  Patient wishes to focus on diet, exercise, and smoking cessation.      Relevant Orders   Comprehensive metabolic panel   Lipid Panel [place], Waived      I discussed the assessment and treatment plan with the patient. The patient was provided an opportunity to ask questions and all were answered. The patient agreed with the plan and demonstrated an understanding of the instructions.   The patient was advised to call back or seek an in-person evaluation if the symptoms worsen or  if the condition fails to improve as anticipated.   I provided 15 minutes of time during this encounter.  Follow up plan: Return in about 6 months (around 07/27/2019) for Annual physical.

## 2019-01-24 NOTE — Assessment & Plan Note (Signed)
Ongoing, continues to not take medication.  Wishes to have thyroid labs and if normal he wishes to maintain control without medication.  Discussed with him at length.  Outpatient labs ordered.  Initiate medication as indicated and if patient agrees.

## 2019-02-07 ENCOUNTER — Other Ambulatory Visit: Payer: BC Managed Care – PPO

## 2019-02-07 ENCOUNTER — Other Ambulatory Visit: Payer: Self-pay

## 2019-02-07 DIAGNOSIS — E039 Hypothyroidism, unspecified: Secondary | ICD-10-CM | POA: Diagnosis not present

## 2019-02-07 DIAGNOSIS — E7800 Pure hypercholesterolemia, unspecified: Secondary | ICD-10-CM | POA: Diagnosis not present

## 2019-02-07 LAB — LIPID PANEL PICCOLO, WAIVED
Chol/HDL Ratio Piccolo,Waive: 5 mg/dL — ABNORMAL HIGH
Cholesterol Piccolo, Waived: 251 mg/dL — ABNORMAL HIGH (ref <200)
HDL Chol Piccolo, Waived: 51 mg/dL — ABNORMAL LOW (ref >59)
LDL Chol Calc Piccolo Waived: 173 mg/dL — ABNORMAL HIGH (ref <100)
Triglycerides Piccolo,Waived: 135 mg/dL (ref <150)
VLDL Chol Calc Piccolo,Waive: 27 mg/dL (ref <30)

## 2019-02-08 LAB — COMPREHENSIVE METABOLIC PANEL
ALT: 41 IU/L (ref 0–44)
AST: 20 IU/L (ref 0–40)
Albumin/Globulin Ratio: 2 (ref 1.2–2.2)
Albumin: 4.5 g/dL (ref 4.0–5.0)
Alkaline Phosphatase: 56 IU/L (ref 39–117)
BUN/Creatinine Ratio: 14 (ref 9–20)
BUN: 14 mg/dL (ref 6–24)
Bilirubin Total: 0.6 mg/dL (ref 0.0–1.2)
CO2: 24 mmol/L (ref 20–29)
Calcium: 9.6 mg/dL (ref 8.7–10.2)
Chloride: 100 mmol/L (ref 96–106)
Creatinine, Ser: 0.97 mg/dL (ref 0.76–1.27)
GFR calc Af Amer: 105 mL/min/{1.73_m2} (ref >59)
GFR calc non Af Amer: 91 mL/min/{1.73_m2} (ref >59)
Globulin, Total: 2.3 g/dL (ref 1.5–4.5)
Glucose: 95 mg/dL (ref 65–99)
Potassium: 4.9 mmol/L (ref 3.5–5.2)
Sodium: 141 mmol/L (ref 134–144)
Total Protein: 6.8 g/dL (ref 6.0–8.5)

## 2019-02-08 LAB — THYROID PANEL WITH TSH
Free Thyroxine Index: 1.6 (ref 1.2–4.9)
T3 Uptake Ratio: 25 % (ref 24–39)
T4, Total: 6.4 ug/dL (ref 4.5–12.0)
TSH: 4.02 u[IU]/mL (ref 0.450–4.500)

## 2019-02-10 DIAGNOSIS — H60502 Unspecified acute noninfective otitis externa, left ear: Secondary | ICD-10-CM | POA: Diagnosis not present

## 2019-02-19 ENCOUNTER — Telehealth: Payer: Self-pay | Admitting: Nurse Practitioner

## 2019-02-19 NOTE — Telephone Encounter (Signed)
Copied from [place] [id number]. Topic: General - Other >> Feb 19, 2019 10:19 AM Percell Belt A wrote: Reason for CRM: pt called in and would like someone to call him with the lab results from 7/31 Best number is [phone]

## 2019-02-19 NOTE — Telephone Encounter (Signed)
Patient notified and verbalized understanding. 

## 2019-02-19 NOTE — Telephone Encounter (Signed)
Will have staff call with message.

## 2019-05-07 ENCOUNTER — Ambulatory Visit: Payer: Self-pay | Admitting: Nurse Practitioner

## 2019-06-23 ENCOUNTER — Other Ambulatory Visit: Payer: Self-pay

## 2019-06-23 DIAGNOSIS — Z20822 Contact with and (suspected) exposure to covid-19: Secondary | ICD-10-CM

## 2019-06-24 ENCOUNTER — Other Ambulatory Visit: Payer: Self-pay | Admitting: Nurse Practitioner

## 2019-06-24 ENCOUNTER — Telehealth: Payer: Self-pay

## 2019-06-24 LAB — NOVEL CORONAVIRUS, NAA: SARS-CoV-2, NAA: NOT DETECTED

## 2019-06-24 NOTE — Telephone Encounter (Signed)
Pt called to get covid test results. Results have not been uploaded in profile yet. Encouraged pt to check MyChart but denied usage.  He plans to call back later today or tomorrow.   [place]

## 2019-06-25 DIAGNOSIS — M9902 Segmental and somatic dysfunction of thoracic region: Secondary | ICD-10-CM | POA: Diagnosis not present

## 2019-06-25 DIAGNOSIS — M9903 Segmental and somatic dysfunction of lumbar region: Secondary | ICD-10-CM | POA: Diagnosis not present

## 2019-06-25 DIAGNOSIS — M9901 Segmental and somatic dysfunction of cervical region: Secondary | ICD-10-CM | POA: Diagnosis not present

## 2019-06-25 DIAGNOSIS — M531 Cervicobrachial syndrome: Secondary | ICD-10-CM | POA: Diagnosis not present

## 2019-07-25 ENCOUNTER — Ambulatory Visit: Payer: Self-pay | Admitting: Nurse Practitioner

## 2019-07-30 DIAGNOSIS — Z20828 Contact with and (suspected) exposure to other viral communicable diseases: Secondary | ICD-10-CM | POA: Diagnosis not present

## 2019-08-18 ENCOUNTER — Ambulatory Visit: Payer: Self-pay | Admitting: Nurse Practitioner

## 2019-09-23 DIAGNOSIS — M9902 Segmental and somatic dysfunction of thoracic region: Secondary | ICD-10-CM | POA: Diagnosis not present

## 2019-09-23 DIAGNOSIS — M531 Cervicobrachial syndrome: Secondary | ICD-10-CM | POA: Diagnosis not present

## 2019-09-23 DIAGNOSIS — M9903 Segmental and somatic dysfunction of lumbar region: Secondary | ICD-10-CM | POA: Diagnosis not present

## 2019-09-23 DIAGNOSIS — M9901 Segmental and somatic dysfunction of cervical region: Secondary | ICD-10-CM | POA: Diagnosis not present

## 2019-12-08 DIAGNOSIS — H6122 Impacted cerumen, left ear: Secondary | ICD-10-CM | POA: Diagnosis not present

## 2019-12-15 ENCOUNTER — Telehealth: Payer: Self-pay | Admitting: Nurse Practitioner

## 2019-12-15 ENCOUNTER — Other Ambulatory Visit: Payer: Self-pay | Admitting: Nurse Practitioner

## 2019-12-15 DIAGNOSIS — N529 Male erectile dysfunction, unspecified: Secondary | ICD-10-CM

## 2019-12-15 MED ORDER — SILDENAFIL CITRATE 100 MG PO TABS: ORAL_TABLET | ORAL | 0 refills | Status: DC

## 2019-12-15 NOTE — Telephone Encounter (Signed)
Routing to provider  

## 2019-12-15 NOTE — Telephone Encounter (Signed)
Sent refill, but for further will need a visit.

## 2019-12-15 NOTE — Telephone Encounter (Signed)
Spoke with pt. Transferred call to Don Perking to schedule appt.

## 2019-12-15 NOTE — Telephone Encounter (Signed)
Pt states he needs a refill of his sildenafil medication. Please advise.

## 2019-12-25 ENCOUNTER — Encounter: Payer: Self-pay | Admitting: Nurse Practitioner

## 2019-12-25 ENCOUNTER — Other Ambulatory Visit: Payer: Self-pay

## 2019-12-25 ENCOUNTER — Ambulatory Visit (INDEPENDENT_AMBULATORY_CARE_PROVIDER_SITE_OTHER): Payer: BC Managed Care – PPO | Admitting: Nurse Practitioner

## 2019-12-25 VITALS — BP 120/81 | HR 79 | Temp 98.3°F | Ht 68.5 in | Wt 185.8 lb

## 2019-12-25 DIAGNOSIS — E7800 Pure hypercholesterolemia, unspecified: Secondary | ICD-10-CM | POA: Diagnosis not present

## 2019-12-25 DIAGNOSIS — Z13220 Encounter for screening for lipoid disorders: Secondary | ICD-10-CM | POA: Diagnosis not present

## 2019-12-25 DIAGNOSIS — Z0000 Encounter for general adult medical examination without abnormal findings: Secondary | ICD-10-CM

## 2019-12-25 DIAGNOSIS — Z114 Encounter for screening for human immunodeficiency virus [HIV]: Secondary | ICD-10-CM

## 2019-12-25 DIAGNOSIS — Z1152 Encounter for screening for COVID-19: Secondary | ICD-10-CM

## 2019-12-25 DIAGNOSIS — E039 Hypothyroidism, unspecified: Secondary | ICD-10-CM | POA: Diagnosis not present

## 2019-12-25 DIAGNOSIS — Z125 Encounter for screening for malignant neoplasm of prostate: Secondary | ICD-10-CM | POA: Diagnosis not present

## 2019-12-25 DIAGNOSIS — Z1159 Encounter for screening for other viral diseases: Secondary | ICD-10-CM

## 2019-12-25 DIAGNOSIS — F17210 Nicotine dependence, cigarettes, uncomplicated: Secondary | ICD-10-CM

## 2019-12-25 DIAGNOSIS — N529 Male erectile dysfunction, unspecified: Secondary | ICD-10-CM | POA: Diagnosis not present

## 2019-12-25 DIAGNOSIS — Z6827 Body mass index (BMI) 27.0-27.9, adult: Secondary | ICD-10-CM | POA: Insufficient documentation

## 2019-12-25 MED ORDER — SILDENAFIL CITRATE 100 MG PO TABS: ORAL_TABLET | ORAL | 8 refills | Status: DC

## 2019-12-25 NOTE — Assessment & Plan Note (Signed)
No current medications and last TSH within normal range.  Recheck TSH and free T4 today, initiate medication as needed.  When he did take, he did not take medication consistently.

## 2019-12-25 NOTE — Progress Notes (Signed)
BP 120/81 (BP Location: Left Arm, Patient Position: Sitting, Cuff Size: Normal)    Pulse 79    Temp 98.3 F (36.8 C) (Oral)    Ht 5' 8.5" (1.74 m)    Wt 185 lb 12.8 oz (84.3 kg)    SpO2 100%    BMI 27.84 kg/m    Subjective:    Patient ID: Sean Campbell, male    DOB: [birth year], 49 y.o.   MRN: [id number]  HPI: Sean Campbell is a 49 y.o. male presenting on 12/25/2019 for comprehensive medical examination. Current medical complaints include:none  He currently lives with: wife and grandchild Interim Problems from his last visit: no   ED -- uses Viagra once every 3-4 months, works well.    No acute or chronic concerns, would like Covid antibody testing today.  HYPOTHYROIDISM No medication at this time and last TSH 4.020 with T4 6.4 in July 2020. Thyroid control status:stable Fatigue: no Cold intolerance: no Heat intolerance: no Weight gain: no Weight loss: no Constipation: no Diarrhea/loose stools: no Palpitations: no Lower extremity edema: no Anxiety/depressed mood: no   The 10-year ASCVD risk score [name], et al., 2013) is: 4.6%   Values used to calculate the score:     Age: 55 years     Sex: Male     Is Non-Hispanic African American: No     Diabetic: No     Tobacco smoker: No     Systolic Blood Pressure: 656 mmHg     Is BP treated: No     HDL Cholesterol: 51 mg/dL     Total Cholesterol: 251 mg/dL   Functional Status Survey: Is the patient deaf or have difficulty hearing?: No Does the patient have difficulty seeing, even when wearing glasses/contacts?: No Does the patient have difficulty concentrating, remembering, or making decisions?: Yes Does the patient have difficulty walking or climbing stairs?: No Does the patient have difficulty dressing or bathing?: No Does the patient have difficulty doing errands alone such as visiting a doctor's office or shopping?: No  FALL RISK: Fall Risk  05/09/2018 10/30/2017  Falls in the past year? No No    Depression  Screen Depression screen [facility] 2/9 12/25/2019 05/09/2018 10/30/2017 09/28/2017  Decreased Interest 2 0 0 3  Down, Depressed, Hopeless 0 0 0 2  PHQ - 2 Score 2 0 0 5  Altered sleeping 0 3 1 3   Tired, decreased energy 1 3 1 3   Change in appetite 0 0 0 3  Feeling bad or failure about yourself  0 0 0 1  Trouble concentrating 2 0 0 3  Moving slowly or fidgety/restless 0 0 0 2  Suicidal thoughts 0 0 0 1  PHQ-9 Score 5 6 2 21   Difficult doing work/chores Not difficult at all - - -    Advanced Directives <no information>  Past Medical History:  Past Medical History:  Diagnosis Date   Anxiety    Hyperlipemia    Hyperthyroidism     Surgical History:  Past Surgical History:  Procedure Laterality Date   COLONOSCOPY WITH PROPOFOL N/A 01/02/2018   Procedure: COLONOSCOPY WITH PROPOFOL;  Surgeon: Virgel Manifold, MD;  Location: [facility] ENDOSCOPY;  Service: Endoscopy;  Laterality: N/A;    Medications:  Current Outpatient Medications on File Prior to Visit  Medication Sig   diazepam (VALIUM) 5 MG tablet Take 1 tablet (5 mg total) by mouth every 12 (twelve) hours as needed for anxiety. (Patient not taking: Reported on 12/25/2019)  No current facility-administered medications on file prior to visit.    Allergies:  No Known Allergies  Social History:  Social History   Socioeconomic History   Marital status: Married    Spouse name: Not on file   Number of children: Not on file   Years of education: Not on file   Highest education level: Not on file  Occupational History   Not on file  Tobacco Use   Smoking status: Former Smoker   Smokeless tobacco: Never Used  Vaping Use   Vaping Use: Never used  Substance and Sexual Activity   Alcohol use: Yes    Alcohol/week: 8.0 standard drinks    Types: 8 Cans of beer per week    Comment: 6 pack per week   Drug use: No   Sexual activity: Yes  Other Topics Concern   Not on file  Social History Narrative   Not on file    Social Determinants of Health   Financial Resource Strain:    Difficulty of Paying Living Expenses:   Food Insecurity:    Worried About Charity fundraiser in the Last Year:    Arboriculturist in the Last Year:   Transportation Needs:    Film/video editor (Medical):    Lack of Transportation (Non-Medical):   Physical Activity:    Days of Exercise per Week:    Minutes of Exercise per Session:   Stress:    Feeling of Stress :   Social Connections:    Frequency of Communication with Friends and Family:    Frequency of Social Gatherings with Friends and Family:    Attends Religious Services:    Active Member of Clubs or Organizations:    Attends Music therapist:    Marital Status:   Intimate Partner Violence:    Fear of Current or Ex-Partner:    Emotionally Abused:    Physically Abused:    Sexually Abused:    Social History   Tobacco Use  Smoking Status Former Smoker  Smokeless Tobacco Never Used   Social History   Substance and Sexual Activity  Alcohol Use Yes   Alcohol/week: 8.0 standard drinks   Types: 8 Cans of beer per week   Comment: 6 pack per week    Family History:  Family History  Problem Relation Age of Onset   Heart disease Father    Cancer Maternal Grandmother        colon   Heart disease Paternal Grandmother    Heart disease Paternal Grandfather     Past medical history, surgical history, medications, allergies, family history and social history reviewed with patient today and changes made to appropriate areas of the chart.   Review of Systems - negative All other ROS negative except what is listed above and in the HPI.      Objective:    BP 120/81 (BP Location: Left Arm, Patient Position: Sitting, Cuff Size: Normal)    Pulse 79    Temp 98.3 F (36.8 C) (Oral)    Ht 5' 8.5" (1.74 m)    Wt 185 lb 12.8 oz (84.3 kg)    SpO2 100%    BMI 27.84 kg/m   Wt Readings from Last 3 Encounters:  12/25/19 185  lb 12.8 oz (84.3 kg)  08/14/18 200 lb (90.7 kg)  07/15/18 198 lb (89.8 kg)    Physical Exam Vitals and nursing note reviewed.  Constitutional:      General: He is awake.  He is not in acute distress.    Appearance: He is well-developed, well-groomed and overweight. He is not ill-appearing.  HENT:     Head: Normocephalic and atraumatic.     Right Ear: Hearing, tympanic membrane, ear canal and external ear normal. No drainage.     Left Ear: Hearing, tympanic membrane, ear canal and external ear normal. No drainage.     Nose: Nose normal.     Mouth/Throat:     Pharynx: Uvula midline.  Eyes:     General: Lids are normal.        Right eye: No discharge.        Left eye: No discharge.     Extraocular Movements: Extraocular movements intact.     Conjunctiva/sclera: Conjunctivae normal.     Pupils: Pupils are equal, round, and reactive to light.     Visual Fields: Right eye visual fields normal and left eye visual fields normal.  Neck:     Thyroid: No thyromegaly.     Vascular: No carotid bruit or JVD.     Trachea: Trachea normal.  Cardiovascular:     Rate and Rhythm: Normal rate and regular rhythm.     Heart sounds: Normal heart sounds, S1 normal and S2 normal. No murmur heard.  No gallop.   Pulmonary:     Effort: Pulmonary effort is normal. No accessory muscle usage or respiratory distress.     Breath sounds: Normal breath sounds.  Abdominal:     General: Bowel sounds are normal.     Palpations: Abdomen is soft. There is no hepatomegaly or splenomegaly.     Tenderness: There is no abdominal tenderness.  Musculoskeletal:        General: Normal range of motion.     Cervical back: Normal range of motion and neck supple.     Right lower leg: No edema.     Left lower leg: No edema.  Lymphadenopathy:     Head:     Right side of head: No submental, submandibular, tonsillar, preauricular or posterior auricular adenopathy.     Left side of head: No submental, submandibular, tonsillar,  preauricular or posterior auricular adenopathy.     Cervical: No cervical adenopathy.  Skin:    General: Skin is warm and dry.     Capillary Refill: Capillary refill takes less than 2 seconds.     Findings: No rash.  Neurological:     Mental Status: He is alert and oriented to person, place, and time.     Cranial Nerves: Cranial nerves are intact.     Gait: Gait is intact.     Deep Tendon Reflexes: Reflexes are normal and symmetric.     Reflex Scores:      Brachioradialis reflexes are 2+ on the right side and 2+ on the left side.      Patellar reflexes are 2+ on the right side and 2+ on the left side. Psychiatric:        Attention and Perception: Attention normal.        Mood and Affect: Mood normal.        Speech: Speech normal.        Behavior: Behavior normal. Behavior is cooperative.        Thought Content: Thought content normal.        Cognition and Memory: Cognition normal.        Judgment: Judgment normal.     Results for orders placed or performed in visit on 06/23/19  Novel  Coronavirus, NAA ([facility])   Specimen: Nasopharyngeal(NP) swabs in vial transport medium   NASOPHARYNGE  TESTING  Result Value Ref Range   SARS-CoV-2, NAA Not Detected Not Detected      Assessment & Plan:   Problem List Items Addressed This Visit      Endocrine   Hypothyroid    No current medications and last TSH within normal range.  Recheck TSH and free T4 today, initiate medication as needed.  When he did take, he did not take medication consistently.      Relevant Orders   TSH   T4, free     Other   ED (erectile dysfunction)    Condition is well controlled.  Encouraged outgoing adherence to medications. 100 mg viagra refilled recently. Follow-up in one year.      Relevant Medications   sildenafil (VIAGRA) 100 MG tablet   Hyperlipidemia    Ongoing, not taking statin.  Current ASCVD score 4.6%, acceptable to focus on diet at this time.  Will check labs and adjust plan of care as  needed based on findings.  Patient wishes to focus on diet, exercise, and smoking cessation.      Relevant Medications   sildenafil (VIAGRA) 100 MG tablet   Other Relevant Orders   Lipid Panel w/o Chol/HDL Ratio   Nicotine dependence, cigarettes, uncomplicated    Currently vaping and has occasional cigarette.  I have recommended complete cessation of tobacco use. I have discussed various options available for assistance with tobacco cessation including over the counter methods (Nicotine gum, patch and lozenges). We also discussed prescription options (Chantix, Nicotine Inhaler / Nasal Spray). The patient is not interested in pursuing any prescription tobacco cessation options at this time.       BMI 27.0-27.9,adult    Praised for 15 pounds weight loss, recommend continued focus on diet and exercise regimen at home.       Other Visit Diagnoses    Encounter for annual physical exam    -  Primary   Relevant Orders   CBC with Differential/Platelet   Comprehensive metabolic panel   Need for hepatitis C screening test       Hep C on labs today   Relevant Orders   Hepatitis C antibody   Encounter for screening for HIV       HIV on labs today   Relevant Orders   HIV Antibody (routine testing w rflx)   Prostate cancer screening       PSa on labs today   Relevant Orders   PSA   Encounter for screening for COVID-19       Covid IGG on labs today   Relevant Orders   SAR CoV2 Serology (COVID 19)AB(IGG)IA       Discussed aspirin prophylaxis for myocardial infarction prevention and decision was it was not indicated  LABORATORY TESTING:  Health maintenance labs ordered today as discussed above.   The natural history of prostate cancer and ongoing controversy regarding screening and potential treatment outcomes of prostate cancer has been discussed with the patient. The meaning of a false positive PSA and a false negative PSA has been discussed. He indicates understanding of the  limitations of this screening test and wishes to proceed with screening PSA testing.   IMMUNIZATIONS:   - Tdap: Tetanus vaccination status reviewed: last tetanus booster within 10 years. - Influenza: Up to date - Pneumovax: Not applicable - Prevnar: Not applicable - Zostavax vaccine: Up to date  SCREENING: - Colonoscopy: Up  to date  Discussed with patient purpose of the colonoscopy is to detect colon cancer at curable precancerous or early stages   - AAA Screening: Not applicable -- former smoker  -Hearing Test: Not applicable  -Spirometry: Not applicable   PATIENT COUNSELING:    Sexuality: Discussed sexually transmitted diseases, partner selection, use of condoms, avoidance of unintended pregnancy  and contraceptive alternatives.   Advised to avoid cigarette smoking.  I discussed with the patient that most people either abstain from alcohol or drink within safe limits (<=14/week and <=4 drinks/occasion for males, <=7/weeks and <= 3 drinks/occasion for females) and that the risk for alcohol disorders and other health effects rises proportionally with the number of drinks per week and how often a drinker exceeds daily limits.  Discussed cessation/primary prevention of drug use and availability of treatment for abuse.   Diet: Encouraged to adjust caloric intake to maintain  or achieve ideal body weight, to reduce intake of dietary saturated fat and total fat, to limit sodium intake by avoiding high sodium foods and not adding table salt, and to maintain adequate dietary potassium and calcium preferably from fresh fruits, vegetables, and low-fat dairy products.    stressed the importance of regular exercise  Injury prevention: Discussed safety belts, safety helmets, smoke detector, smoking near bedding or upholstery.   Dental health: Discussed importance of regular tooth brushing, flossing, and dental visits.   Follow up plan: NEXT PREVENTATIVE PHYSICAL DUE IN 1 YEAR. Return in  about 1 year (around 12/24/2020) for Annual physical.

## 2019-12-25 NOTE — Assessment & Plan Note (Signed)
Condition is well controlled.  Encouraged outgoing adherence to medications. 100 mg viagra refilled recently. Follow-up in one year.

## 2019-12-25 NOTE — Assessment & Plan Note (Signed)
Praised for 15 pounds weight loss, recommend continued focus on diet and exercise regimen at home.

## 2019-12-25 NOTE — Assessment & Plan Note (Signed)
Currently vaping and has occasional cigarette.  I have recommended complete cessation of tobacco use. I have discussed various options available for assistance with tobacco cessation including over the counter methods (Nicotine gum, patch and lozenges). We also discussed prescription options (Chantix, Nicotine Inhaler / Nasal Spray). The patient is not interested in pursuing any prescription tobacco cessation options at this time.

## 2019-12-25 NOTE — Patient Instructions (Signed)
Healthy Eating Following a healthy eating pattern may help you to achieve and maintain a healthy body weight, reduce the risk of chronic disease, and live a long and productive life. It is important to follow a healthy eating pattern at an appropriate calorie level for your body. Your nutritional needs should be met primarily through food by choosing a variety of nutrient-rich foods. What are tips for following this plan? Reading food labels  Read labels and choose the following: ? Reduced or low sodium. ? Juices with 100% fruit juice. ? Foods with low saturated fats and high polyunsaturated and monounsaturated fats. ? Foods with whole grains, such as whole wheat, cracked wheat, brown rice, and wild rice. ? Whole grains that are fortified with folic acid. This is recommended for women who are pregnant or who want to become pregnant.  Read labels and avoid the following: ? Foods with a lot of added sugars. These include foods that contain brown sugar, corn sweetener, corn syrup, dextrose, fructose, glucose, high-fructose corn syrup, honey, invert sugar, lactose, malt syrup, maltose, molasses, raw sugar, sucrose, trehalose, or turbinado sugar.  Do not eat more than the following amounts of added sugar per day:  6 teaspoons (25 g) for women.  9 teaspoons (38 g) for men. ? Foods that contain processed or refined starches and grains. ? Refined grain products, such as white flour, degermed cornmeal, white bread, and white rice. Shopping  Choose nutrient-rich snacks, such as vegetables, whole fruits, and nuts. Avoid high-calorie and high-sugar snacks, such as potato chips, fruit snacks, and candy.  Use oil-based dressings and spreads on foods instead of solid fats such as butter, stick margarine, or cream cheese.  Limit pre-made sauces, mixes, and "instant" products such as flavored rice, instant noodles, and ready-made pasta.  Try more plant-protein sources, such as tofu, tempeh, black beans,  edamame, lentils, nuts, and seeds.  Explore eating plans such as the Mediterranean diet or vegetarian diet. Cooking  Use oil to saut or stir-fry foods instead of solid fats such as butter, stick margarine, or lard.  Try baking, boiling, grilling, or broiling instead of frying.  Remove the fatty part of meats before cooking.  Steam vegetables in water or broth. Meal planning   At meals, imagine dividing your plate into fourths: ? One-half of your plate is fruits and vegetables. ? One-fourth of your plate is whole grains. ? One-fourth of your plate is protein, especially lean meats, poultry, eggs, tofu, beans, or nuts.  Include low-fat dairy as part of your daily diet. Lifestyle  Choose healthy options in all settings, including home, work, school, restaurants, or stores.  Prepare your food safely: ? Wash your hands after handling raw meats. ? Keep food preparation surfaces clean by regularly washing with hot, soapy water. ? Keep raw meats separate from ready-to-eat foods, such as fruits and vegetables. ? Cook seafood, meat, poultry, and eggs to the recommended internal temperature. ? Store foods at safe temperatures. In general:  Keep cold foods at 59F (4.4C) or below.  Keep hot foods at 159F (60C) or above.  Keep your freezer at [facility] (-17.8C) or below.  Foods are no longer safe to eat when they have been between the temperatures of 40-159F (4.4-60C) for more than 2 hours. What foods should I eat? Fruits Aim to eat 2 cup-equivalents of fresh, canned (in natural juice), or frozen fruits each day. Examples of 1 cup-equivalent of fruit include 1 small apple, 8 large strawberries, 1 cup canned fruit,  cup  dried fruit, or 1 cup 100% juice. Vegetables Aim to eat 2-3 cup-equivalents of fresh and frozen vegetables each day, including different varieties and colors. Examples of 1 cup-equivalent of vegetables include 2 medium carrots, 2 cups raw, leafy greens, 1 cup chopped  vegetable (raw or cooked), or 1 medium baked potato. Grains Aim to eat 6 ounce-equivalents of whole grains each day. Examples of 1 ounce-equivalent of grains include 1 slice of bread, 1 cup ready-to-eat cereal, 3 cups popcorn, or  cup cooked rice, pasta, or cereal. Meats and other proteins Aim to eat 5-6 ounce-equivalents of protein each day. Examples of 1 ounce-equivalent of protein include 1 egg, 1/2 cup nuts or seeds, or 1 tablespoon (16 g) peanut butter. A cut of meat or fish that is the size of a deck of cards is about 3-4 ounce-equivalents.  Of the protein you eat each week, try to have at least 8 ounces come from seafood. This includes salmon, trout, herring, and anchovies. Dairy Aim to eat 3 cup-equivalents of fat-free or low-fat dairy each day. Examples of 1 cup-equivalent of dairy include 1 cup (240 mL) milk, 8 ounces (250 g) yogurt, 1 ounces (44 g) natural cheese, or 1 cup (240 mL) fortified soy milk. Fats and oils  Aim for about 5 teaspoons (21 g) per day. Choose monounsaturated fats, such as canola and olive oils, avocados, peanut butter, and most nuts, or polyunsaturated fats, such as sunflower, corn, and soybean oils, walnuts, pine nuts, sesame seeds, sunflower seeds, and flaxseed. Beverages  Aim for six 8-oz glasses of water per day. Limit coffee to three to five 8-oz cups per day.  Limit caffeinated beverages that have added calories, such as soda and energy drinks.  Limit alcohol intake to no more than 1 drink a day for nonpregnant women and 2 drinks a day for men. One drink equals 12 oz of beer (355 mL), 5 oz of wine (148 mL), or 1 oz of hard liquor (44 mL). Seasoning and other foods  Avoid adding excess amounts of salt to your foods. Try flavoring foods with herbs and spices instead of salt.  Avoid adding sugar to foods.  Try using oil-based dressings, sauces, and spreads instead of solid fats. This information is based on general [place] nutrition guidelines. For more  information, visit [employer]. Exact amounts may vary based on your nutrition needs. Summary  A healthy eating plan may help you to maintain a healthy weight, reduce the risk of chronic diseases, and stay active throughout your life.  Plan your meals. Make sure you eat the right portions of a variety of nutrient-rich foods.  Try baking, boiling, grilling, or broiling instead of frying.  Choose healthy options in all settings, including home, work, school, restaurants, or stores. This information is not intended to replace advice given to you by your health care provider. Make sure you discuss any questions you have with your health care provider. Document Revised: 10/08/2017 Document Reviewed: 10/08/2017 Elsevier Patient Education  [place].

## 2019-12-25 NOTE — Assessment & Plan Note (Signed)
Ongoing, not taking statin.  Current ASCVD score 4.6%, acceptable to focus on diet at this time.  Will check labs and adjust plan of care as needed based on findings.  Patient wishes to focus on diet, exercise, and smoking cessation.

## 2019-12-26 LAB — CBC WITH DIFFERENTIAL/PLATELET
Basophils Absolute: 0.1 10*3/uL (ref 0.0–0.2)
Basos: 1 %
EOS (ABSOLUTE): 0.3 10*3/uL (ref 0.0–0.4)
Eos: 4 %
Hematocrit: 47 % (ref 37.5–51.0)
Hemoglobin: 16.5 g/dL (ref 13.0–17.7)
Immature Grans (Abs): 0 10*3/uL (ref 0.0–0.1)
Immature Granulocytes: 0 %
Lymphocytes Absolute: 1.5 10*3/uL (ref 0.7–3.1)
Lymphs: 23 %
MCH: 31.7 pg (ref 26.6–33.0)
MCHC: 35.1 g/dL (ref 31.5–35.7)
MCV: 90 fL (ref 79–97)
Monocytes Absolute: 0.5 10*3/uL (ref 0.1–0.9)
Monocytes: 7 %
Neutrophils Absolute: 4.4 10*3/uL (ref 1.4–7.0)
Neutrophils: 65 %
Platelets: 244 10*3/uL (ref 150–450)
RBC: 5.2 x10E6/uL (ref 4.14–5.80)
RDW: 12.8 % (ref 11.6–15.4)
WBC: 6.8 10*3/uL (ref 3.4–10.8)

## 2019-12-26 LAB — LIPID PANEL W/O CHOL/HDL RATIO
Cholesterol, Total: 209 mg/dL — ABNORMAL HIGH (ref 100–199)
HDL: 48 mg/dL (ref >39)
LDL Chol Calc (NIH): 146 mg/dL — ABNORMAL HIGH (ref 0–99)
Triglycerides: 86 mg/dL (ref 0–149)
VLDL Cholesterol Cal: 15 mg/dL (ref 5–40)

## 2019-12-26 LAB — COMPREHENSIVE METABOLIC PANEL
ALT: 24 IU/L (ref 0–44)
AST: 15 IU/L (ref 0–40)
Albumin/Globulin Ratio: 1.9 (ref 1.2–2.2)
Albumin: 4.7 g/dL (ref 3.8–4.9)
Alkaline Phosphatase: 59 IU/L (ref 48–121)
BUN/Creatinine Ratio: 16 (ref 9–20)
BUN: 15 mg/dL (ref 6–24)
Bilirubin Total: 0.4 mg/dL (ref 0.0–1.2)
CO2: 24 mmol/L (ref 20–29)
Calcium: 9.4 mg/dL (ref 8.7–10.2)
Chloride: 105 mmol/L (ref 96–106)
Creatinine, Ser: 0.93 mg/dL (ref 0.76–1.27)
GFR calc Af Amer: 109 mL/min/{1.73_m2} (ref >59)
GFR calc non Af Amer: 95 mL/min/{1.73_m2} (ref >59)
Globulin, Total: 2.5 g/dL (ref 1.5–4.5)
Glucose: 88 mg/dL (ref 65–99)
Potassium: 5.3 mmol/L — ABNORMAL HIGH (ref 3.5–5.2)
Sodium: 144 mmol/L (ref 134–144)
Total Protein: 7.2 g/dL (ref 6.0–8.5)

## 2019-12-26 LAB — HIV ANTIBODY (ROUTINE TESTING W REFLEX): HIV Screen 4th Generation wRfx: NONREACTIVE

## 2019-12-26 LAB — TSH: TSH: 2.88 u[IU]/mL (ref 0.450–4.500)

## 2019-12-26 LAB — HEPATITIS C ANTIBODY: Hep C Virus Ab: <0.1 s/co ratio (ref 0.0–0.9)

## 2019-12-26 LAB — PSA: Prostate Specific Ag, Serum: 0.2 ng/mL (ref 0.0–4.0)

## 2019-12-26 LAB — SAR COV2 SEROLOGY (COVID19)AB(IGG),IA: DiaSorin SARS-CoV-2 Ab, IgG: NEGATIVE

## 2019-12-26 NOTE — Progress Notes (Signed)
Please let Sean Campbell know his labs have returned: - Hep C is negative.  Covid antibody test was negative. - CBC shows no anemia and kidney and liver function are good. Prostate labs normal. - Thyroid testing is normal, we can stay off medication at this time and monitor. - Potassium level was mildly elevated -- would recommend drinking more water and cutting back on foods high in potassium, will recheck next visit. - Cholesterol levels remain elevated, but continued recommendation for diet focus and regular exercise.  Have a great day and if any questions let me know.:)

## 2020-02-02 DIAGNOSIS — Z20822 Contact with and (suspected) exposure to covid-19: Secondary | ICD-10-CM | POA: Diagnosis not present

## 2020-03-02 DIAGNOSIS — Z20822 Contact with and (suspected) exposure to covid-19: Secondary | ICD-10-CM | POA: Diagnosis not present

## 2020-03-02 DIAGNOSIS — H6691 Otitis media, unspecified, right ear: Secondary | ICD-10-CM | POA: Diagnosis not present

## 2020-05-18 DIAGNOSIS — N410 Acute prostatitis: Secondary | ICD-10-CM | POA: Diagnosis not present

## 2020-05-18 DIAGNOSIS — R350 Frequency of micturition: Secondary | ICD-10-CM | POA: Diagnosis not present

## 2020-06-06 DIAGNOSIS — Z20822 Contact with and (suspected) exposure to covid-19: Secondary | ICD-10-CM | POA: Diagnosis not present

## 2020-08-16 DIAGNOSIS — K5792 Diverticulitis of intestine, part unspecified, without perforation or abscess without bleeding: Secondary | ICD-10-CM | POA: Diagnosis not present

## 2020-08-16 DIAGNOSIS — R1032 Left lower quadrant pain: Secondary | ICD-10-CM | POA: Diagnosis not present

## 2020-08-16 DIAGNOSIS — R194 Change in bowel habit: Secondary | ICD-10-CM | POA: Diagnosis not present

## 2020-08-16 DIAGNOSIS — R109 Unspecified abdominal pain: Secondary | ICD-10-CM | POA: Diagnosis not present

## 2020-08-24 DIAGNOSIS — H6122 Impacted cerumen, left ear: Secondary | ICD-10-CM | POA: Diagnosis not present

## 2020-11-15 DIAGNOSIS — M9902 Segmental and somatic dysfunction of thoracic region: Secondary | ICD-10-CM | POA: Diagnosis not present

## 2020-12-28 ENCOUNTER — Ambulatory Visit (INDEPENDENT_AMBULATORY_CARE_PROVIDER_SITE_OTHER): Payer: BC Managed Care – PPO | Admitting: Nurse Practitioner

## 2020-12-28 ENCOUNTER — Encounter: Payer: Self-pay | Admitting: Nurse Practitioner

## 2020-12-28 ENCOUNTER — Other Ambulatory Visit: Payer: Self-pay

## 2020-12-28 VITALS — BP 110/71 | HR 70 | Temp 98.6°F | Ht 70.0 in | Wt 185.6 lb

## 2020-12-28 DIAGNOSIS — F411 Generalized anxiety disorder: Secondary | ICD-10-CM | POA: Diagnosis not present

## 2020-12-28 DIAGNOSIS — E7800 Pure hypercholesterolemia, unspecified: Secondary | ICD-10-CM

## 2020-12-28 DIAGNOSIS — N528 Other male erectile dysfunction: Secondary | ICD-10-CM

## 2020-12-28 DIAGNOSIS — Z0000 Encounter for general adult medical examination without abnormal findings: Secondary | ICD-10-CM

## 2020-12-28 DIAGNOSIS — Z6827 Body mass index (BMI) 27.0-27.9, adult: Secondary | ICD-10-CM

## 2020-12-28 DIAGNOSIS — N522 Drug-induced erectile dysfunction: Secondary | ICD-10-CM | POA: Diagnosis not present

## 2020-12-28 DIAGNOSIS — E039 Hypothyroidism, unspecified: Secondary | ICD-10-CM | POA: Diagnosis not present

## 2020-12-28 DIAGNOSIS — H9313 Tinnitus, bilateral: Secondary | ICD-10-CM | POA: Insufficient documentation

## 2020-12-28 DIAGNOSIS — Z87891 Personal history of nicotine dependence: Secondary | ICD-10-CM

## 2020-12-28 MED ORDER — SILDENAFIL CITRATE 100 MG PO TABS: ORAL_TABLET | ORAL | 8 refills | Status: DC

## 2020-12-28 MED ORDER — DIAZEPAM 5 MG PO TABS: 5.0000 mg | ORAL_TABLET | Freq: Two times a day (BID) | ORAL | 0 refills | Status: DC | PRN

## 2020-12-28 NOTE — Progress Notes (Signed)
BP 110/71   Pulse 70   Temp 98.6 F (37 C) (Oral)   Ht 5\' 10"  (1.778 m)   Wt 185 lb 9.6 oz (84.2 kg)   SpO2 98%   BMI 26.63 kg/m    Subjective:    Patient ID: Sean Campbell, male    DOB: [birth year], 50 y.o.   MRN: [id number]  HPI: Sean Campbell is a 50 y.o. male presenting on 12/28/2020 for comprehensive medical examination. Current medical complaints include:none  He currently lives with: wife and grandchild Interim Problems from his last visit: no   ED -- uses Viagra once every 3-4 months, works well.  Would like to check testosterone as feels very fatigued and states his levels are low.  Quit smoking 3-4 years ago.    HYPOTHYROIDISM No medication at this time and last TSH 2.880 in June 2021 Thyroid control status:stable Fatigue: no Cold intolerance: no Heat intolerance: no Weight gain: no Weight loss: no Constipation: no Diarrhea/loose stools: no Palpitations: no Lower extremity edema: no Anxiety/depressed mood: no   TINNITUS Has been present since Covid, only bothers when really quiet.  Had Covid back in November 2021.   Duration: months Description of tinnitus: humming Pulsatile: no Tinnitus duration: continuous Episode frequency: continous Severity: mild Aggravating factors: nothing -- notices more in quiet Alleviating factors: nothing Head injury: no Chronic exposure to loud noises: no Exposure to ototoxic medications: no Vertigo:no Hearing loss: no Aural fullness: no Headache:no  TMJ syndrome symptoms: no Unsteady gait: no Postural instability: no Diplopia, dysarthria, dysphagia or weakness: no Anxietydepression: no   The 10-year ASCVD risk score [name], et al., 2013) is: 3.6%   Values used to calculate the score:     Age: 31 years     Sex: Male     Is Non-Hispanic African American: No     Diabetic: No     Tobacco smoker: No     Systolic Blood Pressure: 672 mmHg     Is BP treated: No     HDL Cholesterol: 48 mg/dL     Total Cholesterol: 209  mg/dL   Functional Status Survey: Is the patient deaf or have difficulty hearing?: No Does the patient have difficulty seeing, even when wearing glasses/contacts?: No Does the patient have difficulty concentrating, remembering, or making decisions?: No Does the patient have difficulty walking or climbing stairs?: No Does the patient have difficulty dressing or bathing?: No Does the patient have difficulty doing errands alone such as visiting a doctor's office or shopping?: No  FALL RISK: Fall Risk  12/28/2020 05/09/2018 10/30/2017  Falls in the past year? 0 No No  Number falls in past yr: 0 - -  Injury with Fall? 0 - -  Risk for fall due to : No Fall Risks - -  Follow up Falls evaluation completed - -    Depression Screen Depression screen [facility] 2/9 12/28/2020 12/25/2019 05/09/2018 10/30/2017 09/28/2017  Decreased Interest 0 2 0 0 3  Down, Depressed, Hopeless 0 0 0 0 2  PHQ - 2 Score 0 2 0 0 5  Altered sleeping 0 0 3 1 3   Tired, decreased energy 0 1 3 1 3   Change in appetite 0 0 0 0 3  Feeling bad or failure about yourself  0 0 0 0 1  Trouble concentrating 0 2 0 0 3  Moving slowly or fidgety/restless 0 0 0 0 2  Suicidal thoughts 0 0 0 0 1  PHQ-9 Score 0 5 6 2  21  Difficult doing work/chores Not difficult at all Not difficult at all - - -    Advanced Directives <no information>  Past Medical History:  Past Medical History:  Diagnosis Date   Anxiety    Hyperlipemia    Hyperthyroidism     Surgical History:  Past Surgical History:  Procedure Laterality Date   COLONOSCOPY WITH PROPOFOL N/A 01/02/2018   Procedure: COLONOSCOPY WITH PROPOFOL;  Surgeon: Virgel Manifold, MD;  Location: [facility] ENDOSCOPY;  Service: Endoscopy;  Laterality: N/A;    Medications:  No current outpatient medications on file prior to visit.   No current facility-administered medications on file prior to visit.    Allergies:  No Known Allergies  Social History:  Social History   Socioeconomic  History   Marital status: Married    Spouse name: Not on file   Number of children: Not on file   Years of education: Not on file   Highest education level: Not on file  Occupational History   Not on file  Tobacco Use   Smoking status: Former    Pack years: 0.00   Smokeless tobacco: Never  Vaping Use   Vaping Use: Never used  Substance and Sexual Activity   Alcohol use: Yes    Alcohol/week: 8.0 standard drinks    Types: 8 Cans of beer per week    Comment: 6 pack per week   Drug use: No   Sexual activity: Yes  Other Topics Concern   Not on file  Social History Narrative   Not on file   Social Determinants of Health   Financial Resource Strain: Not on file  Food Insecurity: Not on file  Transportation Needs: Not on file  Physical Activity: Not on file  Stress: Not on file  Social Connections: Not on file  Intimate Partner Violence: Not on file   Social History   Tobacco Use  Smoking Status Former   Pack years: 0.00  Smokeless Tobacco Never   Social History   Substance and Sexual Activity  Alcohol Use Yes   Alcohol/week: 8.0 standard drinks   Types: 8 Cans of beer per week   Comment: 6 pack per week    Family History:  Family History  Problem Relation Age of Onset   Heart disease Father    Cancer Maternal Grandmother        colon   Heart disease Paternal Grandmother    Heart disease Paternal Grandfather     Past medical history, surgical history, medications, allergies, family history and social history reviewed with patient today and changes made to appropriate areas of the chart.   Review of Systems - negative All other ROS negative except what is listed above and in the HPI.      Objective:    BP 110/71   Pulse 70   Temp 98.6 F (37 C) (Oral)   Ht 5\' 10"  (1.778 m)   Wt 185 lb 9.6 oz (84.2 kg)   SpO2 98%   BMI 26.63 kg/m   Wt Readings from Last 3 Encounters:  12/28/20 185 lb 9.6 oz (84.2 kg)  12/25/19 185 lb 12.8 oz (84.3 kg)  08/14/18  200 lb (90.7 kg)    Physical Exam Vitals and nursing note reviewed.  Constitutional:      General: He is awake. He is not in acute distress.    Appearance: He is well-developed, well-groomed and overweight. He is not ill-appearing.  HENT:     Head: Normocephalic and atraumatic.  Right Ear: Hearing, tympanic membrane, ear canal and external ear normal. No drainage.     Left Ear: Hearing, tympanic membrane, ear canal and external ear normal. No drainage.     Nose: Nose normal.     Mouth/Throat:     Pharynx: Uvula midline.  Eyes:     General: Lids are normal.        Right eye: No discharge.        Left eye: No discharge.     Extraocular Movements: Extraocular movements intact.     Conjunctiva/sclera: Conjunctivae normal.     Pupils: Pupils are equal, round, and reactive to light.     Visual Fields: Right eye visual fields normal and left eye visual fields normal.  Neck:     Thyroid: No thyromegaly.     Vascular: No carotid bruit or JVD.     Trachea: Trachea normal.  Cardiovascular:     Rate and Rhythm: Normal rate and regular rhythm.     Heart sounds: Normal heart sounds, S1 normal and S2 normal. No murmur heard.   No gallop.  Pulmonary:     Effort: Pulmonary effort is normal. No accessory muscle usage or respiratory distress.     Breath sounds: Normal breath sounds.  Abdominal:     General: Bowel sounds are normal.     Palpations: Abdomen is soft. There is no hepatomegaly or splenomegaly.     Tenderness: no abdominal tenderness  Musculoskeletal:        General: Normal range of motion.     Cervical back: Normal range of motion and neck supple.     Right lower leg: No edema.     Left lower leg: No edema.  Lymphadenopathy:     Head:     Right side of head: No submental, submandibular, tonsillar, preauricular or posterior auricular adenopathy.     Left side of head: No submental, submandibular, tonsillar, preauricular or posterior auricular adenopathy.     Cervical: No  cervical adenopathy.  Skin:    General: Skin is warm and dry.     Capillary Refill: Capillary refill takes less than 2 seconds.     Findings: No rash.  Neurological:     Mental Status: He is alert and oriented to person, place, and time.     Cranial Nerves: Cranial nerves are intact.     Gait: Gait is intact.     Deep Tendon Reflexes: Reflexes are normal and symmetric.  Psychiatric:        Attention and Perception: Attention normal.        Mood and Affect: Mood normal.        Speech: Speech normal.        Behavior: Behavior normal. Behavior is cooperative.        Thought Content: Thought content normal.        Cognition and Memory: Cognition normal.        Judgment: Judgment normal.    Results for orders placed or performed in visit on 12/25/19  Hepatitis C antibody  Result Value Ref Range   Hep C Virus Ab <0.1 0.0 - 0.9 s/co ratio  HIV Antibody (routine testing w rflx)  Result Value Ref Range   HIV Screen 4th Generation wRfx Non Reactive Non Reactive  SAR CoV2 Serology (COVID 19)AB(IGG)IA  Result Value Ref Range   DiaSorin SARS-CoV-2 Ab, IgG Negative Negative  CBC with Differential/Platelet  Result Value Ref Range   WBC 6.8 3.4 - 10.8 x10E3/uL  RBC 5.20 4.14 - 5.80 x10E6/uL   Hemoglobin 16.5 13.0 - 17.7 g/dL   Hematocrit 47.0 37.5 - 51.0 %   MCV 90 79 - 97 fL   MCH 31.7 26.6 - 33.0 pg   MCHC 35.1 31.5 - 35.7 g/dL   RDW 12.8 11.6 - 15.4 %   Platelets 244 150 - 450 x10E3/uL   Neutrophils 65 Not Estab. %   Lymphs 23 Not Estab. %   Monocytes 7 Not Estab. %   Eos 4 Not Estab. %   Basos 1 Not Estab. %   Neutrophils Absolute 4.4 1.4 - 7.0 x10E3/uL   Lymphocytes Absolute 1.5 0.7 - 3.1 x10E3/uL   Monocytes Absolute 0.5 0.1 - 0.9 x10E3/uL   EOS (ABSOLUTE) 0.3 0.0 - 0.4 x10E3/uL   Basophils Absolute 0.1 0.0 - 0.2 x10E3/uL   Immature Granulocytes 0 Not Estab. %   Immature Grans (Abs) 0.0 0.0 - 0.1 x10E3/uL  Comprehensive metabolic panel  Result Value Ref Range   Glucose  88 65 - 99 mg/dL   BUN 15 6 - 24 mg/dL   Creatinine, Ser 0.93 0.76 - 1.27 mg/dL   GFR calc non Af Amer 95 >59 mL/min/1.73   GFR calc Af Amer 109 >59 mL/min/1.73   BUN/Creatinine Ratio 16 9 - 20   Sodium 144 134 - 144 mmol/L   Potassium 5.3 (H) 3.5 - 5.2 mmol/L   Chloride 105 96 - 106 mmol/L   CO2 24 20 - 29 mmol/L   Calcium 9.4 8.7 - 10.2 mg/dL   Total Protein 7.2 6.0 - 8.5 g/dL   Albumin 4.7 3.8 - 4.9 g/dL   Globulin, Total 2.5 1.5 - 4.5 g/dL   Albumin/Globulin Ratio 1.9 1.2 - 2.2   Bilirubin Total 0.4 0.0 - 1.2 mg/dL   Alkaline Phosphatase 59 48 - 121 IU/L   AST 15 0 - 40 IU/L   ALT 24 0 - 44 IU/L  Lipid Panel w/o Chol/HDL Ratio  Result Value Ref Range   Cholesterol, Total 209 (H) 100 - 199 mg/dL   Triglycerides 86 0 - 149 mg/dL   HDL 48 >39 mg/dL   VLDL Cholesterol Cal 15 5 - 40 mg/dL   LDL Chol Calc (NIH) 146 (H) 0 - 99 mg/dL  TSH  Result Value Ref Range   TSH 2.880 0.450 - 4.500 uIU/mL  PSA  Result Value Ref Range   Prostate Specific Ag, Serum 0.2 0.0 - 4.0 ng/mL      Assessment & Plan:   Problem List Items Addressed This Visit       Endocrine   Hypothyroid    No current medications and last TSH within normal range.  Recheck TSH today, initiate medication as needed.  When he did take, he did not take medication consistently.       Relevant Orders   TSH     Other   ED (erectile dysfunction)    Condition is well controlled.  Encouraged outgoing adherence to medications. 100 mg viagra refilled recently. Follow-up in one year.  Will check testosterone today per his request and he feels this is low.       Relevant Medications   sildenafil (VIAGRA) 100 MG tablet   Other Relevant Orders   Testosterone, free, total([facility])   PSA   Hyperlipidemia - Primary    ACSCVD 3.6%.  Recommend continued diet focus.  Lipid panel and CMP today.       Relevant Medications   sildenafil (VIAGRA) 100 MG tablet  Other Relevant Orders   Comprehensive metabolic  panel   CBC with Differential/Platelet   Lipid Panel w/o Chol/HDL Ratio   Generalized anxiety disorder    Ongoing, stable with minimal use of Valium.  Refill sent in and recommend continue minimal use.       Relevant Medications   diazepam (VALIUM) 5 MG tablet   History of smoking    Recommend continued cessation -- has not smoked in 3-4 years.       BMI 27.0-27.9,adult    BMI 26.63 at this time.  Recommended eating smaller high protein, low fat meals more frequently and exercising 30 mins a day 5 times a week with a goal of 10-15lb weight loss in the next 3 months. Patient voiced their understanding and motivation to adhere to these recommendations.        Tinnitus aurium, bilateral    Since Covid in November, normal exam today.  Recommend ENT visit, he declines at this time.  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  Return if worsening or ongoing and then will pursue ENT.       Other Visit Diagnoses     Annual physical exam       Annual labs today and health maintenance reviewed with patient.        Discussed aspirin prophylaxis for myocardial infarction prevention and decision was it was not indicated  LABORATORY TESTING:  Health maintenance labs ordered today as discussed above.   The natural history of prostate cancer and ongoing controversy regarding screening and potential treatment outcomes of prostate cancer has been discussed with the patient. The meaning of a false positive PSA and a false negative PSA has been discussed. He indicates understanding of the limitations of this screening test and wishes to proceed with screening PSA testing.   IMMUNIZATIONS:   - Tdap: Tetanus vaccination status reviewed: last tetanus booster within 10 years. - Influenza: Up to date - Pneumovax: Not applicable - Prevnar: Not applicable - Zostavax vaccine: Up to date  SCREENING: - Colonoscopy: Up to date  Discussed with patient purpose of the colonoscopy is to detect colon  cancer at curable precancerous or early stages   - AAA Screening: Not applicable -- former smoker  -Hearing Test: Not applicable  -Spirometry: Not applicable   PATIENT COUNSELING:    Sexuality: Discussed sexually transmitted diseases, partner selection, use of condoms, avoidance of unintended pregnancy  and contraceptive alternatives.   Advised to avoid cigarette smoking.  I discussed with the patient that most people either abstain from alcohol or drink within safe limits (<=14/week and <=4 drinks/occasion for males, <=7/weeks and <= 3 drinks/occasion for females) and that the risk for alcohol disorders and other health effects rises proportionally with the number of drinks per week and how often a drinker exceeds daily limits.  Discussed cessation/primary prevention of drug use and availability of treatment for abuse.   Diet: Encouraged to adjust caloric intake to maintain  or achieve ideal body weight, to reduce intake of dietary saturated fat and total fat, to limit sodium intake by avoiding high sodium foods and not adding table salt, and to maintain adequate dietary potassium and calcium preferably from fresh fruits, vegetables, and low-fat dairy products.    Stressed the importance of regular exercise  Injury prevention: Discussed safety belts, safety helmets, smoke detector, smoking near bedding or upholstery.   Dental health: Discussed importance of regular tooth brushing, flossing, and dental visits.   Follow up plan: NEXT PREVENTATIVE PHYSICAL DUE IN 1 YEAR.  Return in about 1 year (around 12/28/2021) for Annual physical.

## 2020-12-28 NOTE — Assessment & Plan Note (Addendum)
Condition is well controlled.  Encouraged outgoing adherence to medications. 100 mg viagra refilled recently. Follow-up in one year.  Will check testosterone today per his request and he feels this is low.

## 2020-12-28 NOTE — Patient Instructions (Signed)
Health Maintenance, Male Adopting a healthy lifestyle and getting preventive care are important in promoting health and wellness. Ask your health care provider about: The right schedule for you to have regular tests and exams. Things you can do on your own to prevent diseases and keep yourself healthy. What should I know about diet, weight, and exercise? Eat a healthy diet  Eat a diet that includes plenty of vegetables, fruits, low-fat dairy products, and lean protein. Do not eat a lot of foods that are high in solid fats, added sugars, or sodium.  Maintain a healthy weight Body mass index (BMI) is a measurement that can be used to identify possible weight problems. It estimates body fat based on height and weight. Your health care provider can help determine your BMI and help you achieve or maintain ahealthy weight. Get regular exercise Get regular exercise. This is one of the most important things you can do for your health. Most adults should: Exercise for at least 150 minutes each week. The exercise should increase your heart rate and make you sweat (moderate-intensity exercise). Do strengthening exercises at least twice a week. This is in addition to the moderate-intensity exercise. Spend less time sitting. Even light physical activity can be beneficial. Watch cholesterol and blood lipids Have your blood tested for lipids and cholesterol at 52 years of age, then havethis test every 5 years. You may need to have your cholesterol levels checked more often if: Your lipid or cholesterol levels are high. You are older than 52 years of age. You are at high risk for heart disease. What should I know about cancer screening? Many types of cancers can be detected early and may often be prevented. Depending on your health history and family history, you may need to have cancer screening at various ages. This may include screening for: Colorectal cancer. Prostate cancer. Skin cancer. Lung  cancer. What should I know about heart disease, diabetes, and high blood pressure? Blood pressure and heart disease High blood pressure causes heart disease and increases the risk of stroke. This is more likely to develop in people who have high blood pressure readings, are of African descent, or are overweight. Talk with your health care provider about your target blood pressure readings. Have your blood pressure checked: Every 3-5 years if you are 18-39 years of age. Every year if you are 40 years old or older. If you are between the ages of 65 and 75 and are a current or former smoker, ask your health care provider if you should have a one-time screening for abdominal aortic aneurysm (AAA). Diabetes Have regular diabetes screenings. This checks your fasting blood sugar level. Have the screening done: Once every three years after age 45 if you are at a normal weight and have a low risk for diabetes. More often and at a younger age if you are overweight or have a high risk for diabetes. What should I know about preventing infection? Hepatitis B If you have a higher risk for hepatitis B, you should be screened for this virus. Talk with your health care provider to find out if you are at risk forhepatitis B infection. Hepatitis C Blood testing is recommended for: Everyone born from 1945 through 1965. Anyone with known risk factors for hepatitis C. Sexually transmitted infections (STIs) You should be screened each year for STIs, including gonorrhea and chlamydia, if: You are sexually active and are younger than 52 years of age. You are older than 52 years of age   and your health care provider tells you that you are at risk for this type of infection. Your sexual activity has changed since you were last screened, and you are at increased risk for chlamydia or gonorrhea. Ask your health care provider if you are at risk. Ask your health care provider about whether you are at high risk for HIV.  Your health care provider may recommend a prescription medicine to help prevent HIV infection. If you choose to take medicine to prevent HIV, you should first get tested for HIV. You should then be tested every 3 months for as long as you are taking the medicine. Follow these instructions at home: Lifestyle Do not use any products that contain nicotine or tobacco, such as cigarettes, e-cigarettes, and chewing tobacco. If you need help quitting, ask your health care provider. Do not use street drugs. Do not share needles. Ask your health care provider for help if you need support or information about quitting drugs. Alcohol use Do not drink alcohol if your health care provider tells you not to drink. If you drink alcohol: Limit how much you have to 0-2 drinks a day. Be aware of how much alcohol is in your drink. In the [place] one drink equals one 12 oz bottle of beer (355 mL), one 5 oz glass of wine (148 mL), or one 1 oz glass of hard liquor (44 mL). General instructions Schedule regular health, dental, and eye exams. Stay current with your vaccines. Tell your health care provider if: You often feel depressed. You have ever been abused or do not feel safe at home. Summary Adopting a healthy lifestyle and getting preventive care are important in promoting health and wellness. Follow your health care provider's instructions about healthy diet, exercising, and getting tested or screened for diseases. Follow your health care provider's instructions on monitoring your cholesterol and blood pressure. This information is not intended to replace advice given to you by your health care provider. Make sure you discuss any questions you have with your healthcare provider. Document Revised: 06/19/2018 Document Reviewed: 06/19/2018 Elsevier Patient Education  2022 Elsevier Inc.  

## 2020-12-28 NOTE — Assessment & Plan Note (Signed)
Recommend continued cessation -- has not smoked in 3-4 years.

## 2020-12-28 NOTE — Assessment & Plan Note (Signed)
ACSCVD 3.6%.  Recommend continued diet focus.  Lipid panel and CMP today.

## 2020-12-28 NOTE — Assessment & Plan Note (Signed)
BMI 26.63 at this time.  Recommended eating smaller high protein, low fat meals more frequently and exercising 30 mins a day 5 times a week with a goal of 10-15lb weight loss in the next 3 months. Patient voiced their understanding and motivation to adhere to these recommendations.

## 2020-12-28 NOTE — Assessment & Plan Note (Signed)
No current medications and last TSH within normal range.  Recheck TSH today, initiate medication as needed.  When he did take, he did not take medication consistently.

## 2020-12-28 NOTE — Assessment & Plan Note (Signed)
Since Covid in November, normal exam today.  Recommend ENT visit, he declines at this time.  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  Return if worsening or ongoing and then will pursue ENT.

## 2020-12-28 NOTE — Assessment & Plan Note (Signed)
Ongoing, stable with minimal use of Valium.  Refill sent in and recommend continue minimal use.

## 2020-12-29 NOTE — Progress Notes (Signed)
Good morning, please let [name] know his labs have returned.  Testosterone level at this time is normal at 554 (normal range 264 to 916), we can recheck this in 8 weeks if you would like, but at this time is normal.  Thyroid level remains normal, no medication needed.  Cholesterol levels remain quite elevated, I do recommend if any family history of stroke or heart attack we start medication to help lower this.  Would you like to try this?  Remainder of labs normal.  Any questions? Keep being awesome!!  Thank you for allowing me to participate in your care.  I appreciate you. Kindest regards, Jazmaine Fuelling

## 2020-12-31 ENCOUNTER — Other Ambulatory Visit: Payer: Self-pay | Admitting: Nurse Practitioner

## 2020-12-31 ENCOUNTER — Telehealth: Payer: Self-pay | Admitting: Nurse Practitioner

## 2020-12-31 LAB — CBC WITH DIFFERENTIAL/PLATELET
Basophils Absolute: 0.1 10*3/uL (ref 0.0–0.2)
Basos: 1 %
EOS (ABSOLUTE): 0.4 10*3/uL (ref 0.0–0.4)
Eos: 6 %
Hematocrit: 47.9 % (ref 37.5–51.0)
Hemoglobin: 16.7 g/dL (ref 13.0–17.7)
Immature Grans (Abs): 0 10*3/uL (ref 0.0–0.1)
Immature Granulocytes: 0 %
Lymphocytes Absolute: 1.6 10*3/uL (ref 0.7–3.1)
Lymphs: 26 %
MCH: 31.7 pg (ref 26.6–33.0)
MCHC: 34.9 g/dL (ref 31.5–35.7)
MCV: 91 fL (ref 79–97)
Monocytes Absolute: 0.5 10*3/uL (ref 0.1–0.9)
Monocytes: 8 %
Neutrophils Absolute: 3.6 10*3/uL (ref 1.4–7.0)
Neutrophils: 59 %
Platelets: 234 10*3/uL (ref 150–450)
RBC: 5.26 x10E6/uL (ref 4.14–5.80)
RDW: 13 % (ref 11.6–15.4)
WBC: 6 10*3/uL (ref 3.4–10.8)

## 2020-12-31 LAB — COMPREHENSIVE METABOLIC PANEL
ALT: 21 IU/L (ref 0–44)
AST: 17 IU/L (ref 0–40)
Albumin/Globulin Ratio: 2.3 — ABNORMAL HIGH (ref 1.2–2.2)
Albumin: 4.9 g/dL (ref 3.8–4.9)
Alkaline Phosphatase: 56 IU/L (ref 44–121)
BUN/Creatinine Ratio: 13 (ref 9–20)
BUN: 12 mg/dL (ref 6–24)
Bilirubin Total: 0.6 mg/dL (ref 0.0–1.2)
CO2: 21 mmol/L (ref 20–29)
Calcium: 9.5 mg/dL (ref 8.7–10.2)
Chloride: 101 mmol/L (ref 96–106)
Creatinine, Ser: 0.94 mg/dL (ref 0.76–1.27)
Globulin, Total: 2.1 g/dL (ref 1.5–4.5)
Glucose: 95 mg/dL (ref 65–99)
Potassium: 4.5 mmol/L (ref 3.5–5.2)
Sodium: 141 mmol/L (ref 134–144)
Total Protein: 7 g/dL (ref 6.0–8.5)
eGFR: 98 mL/min/{1.73_m2} (ref >59)

## 2020-12-31 LAB — TESTOSTERONE, FREE, TOTAL, SHBG
Sex Hormone Binding: 42.4 nmol/L (ref 19.3–76.4)
Testosterone, Free: 12.4 pg/mL (ref 7.2–24.0)
Testosterone: 554 ng/dL (ref 264–916)

## 2020-12-31 LAB — LIPID PANEL W/O CHOL/HDL RATIO
Cholesterol, Total: 248 mg/dL — ABNORMAL HIGH (ref 100–199)
HDL: 48 mg/dL (ref >39)
LDL Chol Calc (NIH): 178 mg/dL — ABNORMAL HIGH (ref 0–99)
Triglycerides: 124 mg/dL (ref 0–149)
VLDL Cholesterol Cal: 22 mg/dL (ref 5–40)

## 2020-12-31 LAB — PSA: Prostate Specific Ag, Serum: 0.4 ng/mL (ref 0.0–4.0)

## 2020-12-31 LAB — TSH: TSH: 2.99 u[IU]/mL (ref 0.450–4.500)

## 2020-12-31 MED ORDER — ROSUVASTATIN CALCIUM 10 MG PO TABS: 10.0000 mg | ORAL_TABLET | Freq: Every day | ORAL | 3 refills | Status: DC

## 2020-12-31 NOTE — Telephone Encounter (Signed)
Pt is calling to receive lab results again. He states he did not write them down. Please advise CB- [phone]

## 2020-12-31 NOTE — Telephone Encounter (Signed)
Went over patient lab results again, per patient he states he would like to go ahead and start cholesterol medication. Send in to preferred pharmacy [facility] in [place].

## 2020-12-31 NOTE — Telephone Encounter (Signed)
Attempted to call patient, no answer not able to LVM. Will try to call patient later

## 2021-01-03 NOTE — Telephone Encounter (Signed)
Pt scheduled and verbalized understanding.

## 2021-02-08 DIAGNOSIS — K5792 Diverticulitis of intestine, part unspecified, without perforation or abscess without bleeding: Secondary | ICD-10-CM | POA: Diagnosis not present

## 2021-02-08 DIAGNOSIS — R1032 Left lower quadrant pain: Secondary | ICD-10-CM | POA: Diagnosis not present

## 2021-03-01 ENCOUNTER — Other Ambulatory Visit: Payer: BC Managed Care – PPO

## 2021-03-28 ENCOUNTER — Ambulatory Visit (INDEPENDENT_AMBULATORY_CARE_PROVIDER_SITE_OTHER): Payer: BC Managed Care – PPO | Admitting: Nurse Practitioner

## 2021-03-28 ENCOUNTER — Other Ambulatory Visit: Payer: Self-pay

## 2021-03-28 ENCOUNTER — Encounter: Payer: Self-pay | Admitting: Nurse Practitioner

## 2021-03-28 VITALS — BP 118/74 | HR 76 | Temp 98.7°F | Wt 187.2 lb

## 2021-03-28 DIAGNOSIS — M5441 Lumbago with sciatica, right side: Secondary | ICD-10-CM

## 2021-03-28 DIAGNOSIS — R1084 Generalized abdominal pain: Secondary | ICD-10-CM | POA: Diagnosis not present

## 2021-03-28 LAB — URINALYSIS, ROUTINE W REFLEX MICROSCOPIC
Bilirubin, UA: NEGATIVE
Glucose, UA: NEGATIVE
Ketones, UA: NEGATIVE
Leukocytes,UA: NEGATIVE
Nitrite, UA: NEGATIVE
Protein,UA: NEGATIVE
RBC, UA: NEGATIVE
Specific Gravity, UA: 1.02 (ref 1.005–1.030)
Urobilinogen, Ur: 0.2 mg/dL (ref 0.2–1.0)
pH, UA: 6 (ref 5.0–7.5)

## 2021-03-28 MED ORDER — CYCLOBENZAPRINE HCL 10 MG PO TABS
10.0000 mg | ORAL_TABLET | Freq: Three times a day (TID) | ORAL | 0 refills | Status: DC | PRN
Start: 2021-03-28 — End: 2022-01-01

## 2021-03-28 MED ORDER — PREDNISONE 10 MG PO TABS: ORAL_TABLET | ORAL | 0 refills | Status: DC

## 2021-03-28 NOTE — Patient Instructions (Signed)

## 2021-03-28 NOTE — Progress Notes (Signed)
BP 118/74   Pulse 76   Temp 98.7 F (37.1 C) (Oral)   Wt 187 lb 3.2 oz (84.9 kg)   SpO2 98%   BMI 26.86 kg/m    Subjective:    Patient ID: Sean Campbell, male    DOB: 02-12-1971, 50 y.o.   MRN: [id number]  HPI: Sean Campbell is a 50 y.o. male  Chief Complaint  Patient presents with   Back Pain    Patient states he first noticed the pain about 10 days ago. Patient states he has tried Extra Strength Tylenol for the discomfort and pain. Patient states it is a constant aching pain.    BACK PAIN Was at work 10 days ago and fell -- walking up treads and fell through the treads, landed with straddle to middle riser of steps.  Did not hit back or land on back during the time.  4-6 days later his back started hurting -- right lower back.   Duration: days Mechanism of injury:  fall Location: Right Onset: sudden Severity: 6/10 Quality: dull, shooting, and aching, throbbing Frequency: intermittent Radiation: buttocks Aggravating factors: lifting, movement, walking, and bending Alleviating factors: APAP Status: fluctuating Treatments attempted: APAP  Relief with NSAIDs?: No NSAIDs Taken Nighttime pain:   occasional, stiff in morning Paresthesias / decreased sensation:  no Bowel / bladder incontinence:  no Fevers:  no Dysuria / urinary frequency:  no   Relevant past medical, surgical, family and social history reviewed and updated as indicated. Interim medical history since our last visit reviewed. Allergies and medications reviewed and updated.  Review of Systems  Constitutional:  Negative for activity change, diaphoresis, fatigue and fever.  Respiratory:  Negative for cough, chest tightness, shortness of breath and wheezing.   Cardiovascular:  Negative for chest pain, palpitations and leg swelling.  Musculoskeletal:  Positive for back pain.  Neurological: Negative.   Psychiatric/Behavioral: Negative.     Per HPI unless specifically indicated above     Objective:    BP 118/74    Pulse 76   Temp 98.7 F (37.1 C) (Oral)   Wt 187 lb 3.2 oz (84.9 kg)   SpO2 98%   BMI 26.86 kg/m   Wt Readings from Last 3 Encounters:  03/28/21 187 lb 3.2 oz (84.9 kg)  12/28/20 185 lb 9.6 oz (84.2 kg)  12/25/19 185 lb 12.8 oz (84.3 kg)    Physical Exam Vitals and nursing note reviewed.  Constitutional:      General: He is awake. He is not in acute distress.    Appearance: He is well-developed and well-groomed. He is not ill-appearing or toxic-appearing.  HENT:     Head: Normocephalic and atraumatic.     Right Ear: Hearing normal. No drainage.     Left Ear: Hearing normal. No drainage.  Eyes:     General: Lids are normal.        Right eye: No discharge.        Left eye: No discharge.     Conjunctiva/sclera: Conjunctivae normal.     Pupils: Pupils are equal, round, and reactive to light.  Neck:     Thyroid: No thyromegaly.     Vascular: No carotid bruit.  Cardiovascular:     Rate and Rhythm: Normal rate and regular rhythm.     Heart sounds: Normal heart sounds, S1 normal and S2 normal. No murmur heard.   No gallop.  Pulmonary:     Effort: Pulmonary effort is normal. No accessory muscle usage or respiratory  distress.     Breath sounds: Normal breath sounds.  Abdominal:     General: Bowel sounds are normal.     Palpations: Abdomen is soft. There is no hepatomegaly or splenomegaly.  Musculoskeletal:     Cervical back: Normal range of motion and neck supple.     Lumbar back: Spasms and tenderness present. No swelling or edema. Normal range of motion. Negative right straight leg raise test and negative left straight leg raise test.     Right lower leg: No edema.     Left lower leg: No edema.     Comments: No decreased ROM, some discomfort reported with flexion and extension + rotation.  No rashes noted or bruising.  Skin:    General: Skin is warm and dry.     Capillary Refill: Capillary refill takes less than 2 seconds.     Findings: No rash.  Neurological:     Mental  Status: He is alert and oriented to person, place, and time.     Deep Tendon Reflexes: Reflexes are normal and symmetric.     Reflex Scores:      Brachioradialis reflexes are 2+ on the right side and 2+ on the left side.      Patellar reflexes are 2+ on the right side and 2+ on the left side. Psychiatric:        Attention and Perception: Attention normal.        Mood and Affect: Mood normal.        Speech: Speech normal.        Behavior: Behavior normal. Behavior is cooperative.        Thought Content: Thought content normal.    Results for orders placed or performed in visit on 12/28/20  Comprehensive metabolic panel  Result Value Ref Range   Glucose 95 65 - 99 mg/dL   BUN 12 6 - 24 mg/dL   Creatinine, Ser 0.94 0.76 - 1.27 mg/dL   eGFR 98 >59 mL/min/1.73   BUN/Creatinine Ratio 13 9 - 20   Sodium 141 134 - 144 mmol/L   Potassium 4.5 3.5 - 5.2 mmol/L   Chloride 101 96 - 106 mmol/L   CO2 21 20 - 29 mmol/L   Calcium 9.5 8.7 - 10.2 mg/dL   Total Protein 7.0 6.0 - 8.5 g/dL   Albumin 4.9 3.8 - 4.9 g/dL   Globulin, Total 2.1 1.5 - 4.5 g/dL   Albumin/Globulin Ratio 2.3 (H) 1.2 - 2.2   Bilirubin Total 0.6 0.0 - 1.2 mg/dL   Alkaline Phosphatase 56 44 - 121 IU/L   AST 17 0 - 40 IU/L   ALT 21 0 - 44 IU/L  CBC with Differential/Platelet  Result Value Ref Range   WBC 6.0 3.4 - 10.8 x10E3/uL   RBC 5.26 4.14 - 5.80 x10E6/uL   Hemoglobin 16.7 13.0 - 17.7 g/dL   Hematocrit 47.9 37.5 - 51.0 %   MCV 91 79 - 97 fL   MCH 31.7 26.6 - 33.0 pg   MCHC 34.9 31.5 - 35.7 g/dL   RDW 13.0 11.6 - 15.4 %   Platelets 234 150 - 450 x10E3/uL   Neutrophils 59 Not Estab. %   Lymphs 26 Not Estab. %   Monocytes 8 Not Estab. %   Eos 6 Not Estab. %   Basos 1 Not Estab. %   Neutrophils Absolute 3.6 1.4 - 7.0 x10E3/uL   Lymphocytes Absolute 1.6 0.7 - 3.1 x10E3/uL   Monocytes Absolute 0.5 0.1 - 0.9  x10E3/uL   EOS (ABSOLUTE) 0.4 0.0 - 0.4 x10E3/uL   Basophils Absolute 0.1 0.0 - 0.2 x10E3/uL   Immature  Granulocytes 0 Not Estab. %   Immature Grans (Abs) 0.0 0.0 - 0.1 x10E3/uL  Lipid Panel w/o Chol/HDL Ratio  Result Value Ref Range   Cholesterol, Total 248 (H) 100 - 199 mg/dL   Triglycerides 124 0 - 149 mg/dL   HDL 48 >39 mg/dL   VLDL Cholesterol Cal 22 5 - 40 mg/dL   LDL Chol Calc (NIH) 178 (H) 0 - 99 mg/dL  TSH  Result Value Ref Range   TSH 2.990 0.450 - 4.500 uIU/mL  Testosterone, free, total([facility])  Result Value Ref Range   Testosterone 554 264 - 916 ng/dL   Testosterone, Free 12.4 7.2 - 24.0 pg/mL   Sex Hormone Binding 42.4 19.3 - 76.4 nmol/L  PSA  Result Value Ref Range   Prostate Specific Ag, Serum 0.4 0.0 - 4.0 ng/mL      Assessment & Plan:   Problem List Items Addressed This Visit       Nervous and Auditory   Acute right-sided low back pain with right-sided sciatica - Primary    Acute x 10 days with no improvement with simple treatment at home.  No red flags on exam.  UA negative.  At this time send in Prednisone taper and Flexeril to use at HS.  Recommend he not use Flexeril if driving or using heavy equipment.  Recommend continue Tylenol as needed.  He decline Toradol shot in office today.  Alternate heat and ice + use TENS machine.  Return for worsening or ongoing pain.      Relevant Medications   predniSONE (DELTASONE) 10 MG tablet   cyclobenzaprine (FLEXERIL) 10 MG tablet   Other Relevant Orders   Urinalysis, Routine w reflex microscopic     Follow up plan: Return if symptoms worsen or fail to improve.

## 2021-03-28 NOTE — Assessment & Plan Note (Addendum)
Acute x 10 days with no improvement with simple treatment at home.  No red flags on exam.  UA negative.  At this time send in Prednisone taper and Flexeril to use at HS.  Recommend he not use Flexeril if driving or using heavy equipment.  Recommend continue Tylenol as needed.  He decline Toradol shot in office today.  Alternate heat and ice + use TENS machine.  Return for worsening or ongoing pain.

## 2021-04-04 ENCOUNTER — Ambulatory Visit: Payer: BC Managed Care – PPO | Admitting: Nurse Practitioner

## 2021-06-28 DIAGNOSIS — H6092 Unspecified otitis externa, left ear: Secondary | ICD-10-CM | POA: Diagnosis not present

## 2022-01-01 DIAGNOSIS — Z79899 Other long term (current) drug therapy: Secondary | ICD-10-CM | POA: Insufficient documentation

## 2022-01-02 ENCOUNTER — Encounter: Payer: Self-pay | Admitting: Nurse Practitioner

## 2022-01-02 ENCOUNTER — Ambulatory Visit (INDEPENDENT_AMBULATORY_CARE_PROVIDER_SITE_OTHER): Payer: BC Managed Care – PPO | Admitting: Nurse Practitioner

## 2022-01-02 VITALS — BP 105/69 | HR 65 | Temp 97.8°F | Ht 70.75 in | Wt 188.2 lb

## 2022-01-02 DIAGNOSIS — Z0000 Encounter for general adult medical examination without abnormal findings: Secondary | ICD-10-CM | POA: Diagnosis not present

## 2022-01-02 DIAGNOSIS — F411 Generalized anxiety disorder: Secondary | ICD-10-CM | POA: Diagnosis not present

## 2022-01-02 DIAGNOSIS — Z79899 Other long term (current) drug therapy: Secondary | ICD-10-CM

## 2022-01-02 DIAGNOSIS — E039 Hypothyroidism, unspecified: Secondary | ICD-10-CM

## 2022-01-02 DIAGNOSIS — N50811 Right testicular pain: Secondary | ICD-10-CM

## 2022-01-02 DIAGNOSIS — N528 Other male erectile dysfunction: Secondary | ICD-10-CM

## 2022-01-02 DIAGNOSIS — E7800 Pure hypercholesterolemia, unspecified: Secondary | ICD-10-CM | POA: Diagnosis not present

## 2022-01-02 DIAGNOSIS — Z87891 Personal history of nicotine dependence: Secondary | ICD-10-CM

## 2022-01-02 DIAGNOSIS — Z23 Encounter for immunization: Secondary | ICD-10-CM

## 2022-01-02 DIAGNOSIS — Z6827 Body mass index (BMI) 27.0-27.9, adult: Secondary | ICD-10-CM

## 2022-01-02 NOTE — Assessment & Plan Note (Signed)
Chronic, ongoing with minimal use. Refer to [facility] plan of care.

## 2022-01-02 NOTE — Assessment & Plan Note (Signed)
ASCVD 4.5%. Not taking Crestor due to muscle pain. Recommend continue diet focus. Lipid panel and CMP today. Consider adding Zetia in the future.

## 2022-01-02 NOTE — Assessment & Plan Note (Signed)
No current medication use. Previous TSH within normal range. Recheck TSH and free T4 today. Initiate medication as needed.

## 2022-01-03 LAB — COMPREHENSIVE METABOLIC PANEL
ALT: 32 IU/L (ref 0–44)
AST: 20 IU/L (ref 0–40)
Albumin/Globulin Ratio: 2.1 (ref 1.2–2.2)
Albumin: 4.6 g/dL (ref 3.8–4.9)
Alkaline Phosphatase: 54 IU/L (ref 44–121)
BUN/Creatinine Ratio: 14 (ref 9–20)
BUN: 14 mg/dL (ref 6–24)
Bilirubin Total: 0.6 mg/dL (ref 0.0–1.2)
CO2: 23 mmol/L (ref 20–29)
Calcium: 9.2 mg/dL (ref 8.7–10.2)
Chloride: 104 mmol/L (ref 96–106)
Creatinine, Ser: 1 mg/dL (ref 0.76–1.27)
Globulin, Total: 2.2 g/dL (ref 1.5–4.5)
Glucose: 100 mg/dL — ABNORMAL HIGH (ref 70–99)
Potassium: 4.4 mmol/L (ref 3.5–5.2)
Sodium: 141 mmol/L (ref 134–144)
Total Protein: 6.8 g/dL (ref 6.0–8.5)
eGFR: 90 mL/min/{1.73_m2} (ref >59)

## 2022-01-03 LAB — CBC WITH DIFFERENTIAL/PLATELET
Basophils Absolute: 0.1 10*3/uL (ref 0.0–0.2)
Basos: 1 %
EOS (ABSOLUTE): 0.4 10*3/uL (ref 0.0–0.4)
Eos: 8 %
Hematocrit: 44.8 % (ref 37.5–51.0)
Hemoglobin: 15.8 g/dL (ref 13.0–17.7)
Immature Grans (Abs): 0 10*3/uL (ref 0.0–0.1)
Immature Granulocytes: 0 %
Lymphocytes Absolute: 1.5 10*3/uL (ref 0.7–3.1)
Lymphs: 27 %
MCH: 32.2 pg (ref 26.6–33.0)
MCHC: 35.3 g/dL (ref 31.5–35.7)
MCV: 91 fL (ref 79–97)
Monocytes Absolute: 0.5 10*3/uL (ref 0.1–0.9)
Monocytes: 9 %
Neutrophils Absolute: 3.1 10*3/uL (ref 1.4–7.0)
Neutrophils: 55 %
Platelets: 212 10*3/uL (ref 150–450)
RBC: 4.9 x10E6/uL (ref 4.14–5.80)
RDW: 12.9 % (ref 11.6–15.4)
WBC: 5.6 10*3/uL (ref 3.4–10.8)

## 2022-01-03 LAB — LIPID PANEL W/O CHOL/HDL RATIO
Cholesterol, Total: 221 mg/dL — ABNORMAL HIGH (ref 100–199)
HDL: 48 mg/dL (ref >39)
LDL Chol Calc (NIH): 147 mg/dL — ABNORMAL HIGH (ref 0–99)
Triglycerides: 145 mg/dL (ref 0–149)
VLDL Cholesterol Cal: 26 mg/dL (ref 5–40)

## 2022-01-03 LAB — PSA: Prostate Specific Ag, Serum: 0.2 ng/mL (ref 0.0–4.0)

## 2022-01-03 LAB — T4, FREE: Free T4: 0.99 ng/dL (ref 0.82–1.77)

## 2022-01-03 LAB — TSH: TSH: 3.15 u[IU]/mL (ref 0.450–4.500)

## 2022-01-03 LAB — THYROID PEROXIDASE ANTIBODY: Thyroperoxidase Ab SerPl-aCnc: <9 IU/mL (ref 0–34)

## 2022-01-04 ENCOUNTER — Ambulatory Visit
Admission: RE | Admit: 2022-01-04 | Discharge: 2022-01-04 | Disposition: A | Discharge status: Home / Self Care | Payer: BC Managed Care – PPO | Source: Ambulatory Visit | Attending: Nurse Practitioner | Admitting: Nurse Practitioner

## 2022-01-04 DIAGNOSIS — N50811 Right testicular pain: Secondary | ICD-10-CM | POA: Diagnosis not present

## 2022-01-04 DIAGNOSIS — N503 Cyst of epididymis: Secondary | ICD-10-CM | POA: Diagnosis not present

## 2022-01-06 LAB — BENZODIAZEPINES CONFIRM, URINE
Alprazolam Conf.: 471 ng/mL
Alprazolam: POSITIVE — AB
Benzodiazepines: POSITIVE ng/mL — AB
Clonazepam: NEGATIVE
Flurazepam: NEGATIVE
Lorazepam: NEGATIVE
Midazolam: NEGATIVE
Nordiazepam: NEGATIVE
Oxazepam: NEGATIVE
Temazepam: NEGATIVE
Triazolam: NEGATIVE

## 2022-01-06 LAB — DRUG SCREEN 764883 11+OXYCO+ALC+CRT-BUND
Amphetamines, Urine: NEGATIVE ng/mL
Barbiturate: NEGATIVE ng/mL
Cannabinoid Quant, Ur: NEGATIVE ng/mL
Cocaine (Metabolite): NEGATIVE ng/mL
Creatinine: 146.1 mg/dL (ref 20.0–300.0)
Ethanol: NEGATIVE %
Meperidine: NEGATIVE ng/mL
Methadone Screen, Urine: NEGATIVE ng/mL
OPIATE SCREEN URINE: NEGATIVE ng/mL
Phencyclidine: NEGATIVE ng/mL
Propoxyphene: NEGATIVE ng/mL
Tramadol: NEGATIVE ng/mL
pH, Urine: 5.5 (ref 4.5–8.9)

## 2022-01-06 LAB — OXYCODONE/OXYMORPHONE, CONFIRM
OXYCODONE/OXYMORPH: POSITIVE — AB
OXYCODONE: NEGATIVE
OXYMORPHONE (GC/MS): 392 ng/mL
OXYMORPHONE: POSITIVE — AB

## 2022-01-06 NOTE — Progress Notes (Signed)
Contacted via MyChart   Good afternoon Sean Campbell, we did you annual drug screen since you occasionally get Valium still and this did return positive for Xanax and Oxycodone, have you been taking either of these recently?  Let me know.  Thanks!! Keep being amazing!!  Thank you for allowing me to participate in your care.  I appreciate you. Kindest regards, Zaide Mcclenahan

## 2022-01-06 NOTE — Progress Notes (Signed)
Contacted via MyChart   Good afternoon Sean Campbell, your imaging returned and this did not show any cancerous findings!!  Sean Campbell.  It did show a small cyst to the right side.  If this is not causing any discomfort and does not change in size we can continue to monitor this.  Any questions? Keep being stellar!!  Thank you for allowing me to participate in your care.  I appreciate you. Kindest regards, Sean Campbell

## 2022-01-19 ENCOUNTER — Encounter: Payer: Self-pay | Admitting: Nurse Practitioner

## 2022-01-20 NOTE — Telephone Encounter (Signed)
LMOM advising patient to return my call directly at [phone].

## 2022-01-23 NOTE — Telephone Encounter (Signed)
Spoke with patient regarding his patient/provider confidentiality and privacy concerns. Patient wanted to know if drug testing was a standard protocol. Explained to patient that providers typically test when controlled substances are prescribed. Patient acknowledged understanding and expressed that his main concern is that his MyChart is connected to his work e-mail address. Patient stated that he took some of his wife's medication which caused his drug screen to be positive and is worried that he could be judged by other providers due to the results of his drug test. Offered to email documents to patient so he could request removal of drug test from medical record. Patient stated that he would rather have the documents mailed to his home instead of being sent by email but doubts that he will fill the papers out to pursue removal of the information. Patient stated that since he has had his drug screen, he would like to request that Jolene send in a prescription for Xanax. Informed patient that I would send a request to the provider. Confirmed patient's mailing address and advised that I would mail documents as discussed. Also advised patient to contact me at [phone] if he would like to proceed with completing documents. Please let patient know if medication will be sent to pharmacy as requested.

## 2022-01-24 MED ORDER — DIAZEPAM 5 MG PO TABS: 5.0000 mg | ORAL_TABLET | Freq: Two times a day (BID) | ORAL | 0 refills | Status: DC | PRN

## 2022-01-24 NOTE — Addendum Note (Signed)
Addended by: Marnee Guarneri T on: 01/24/2022 06:31 PM   Modules accepted: Orders

## 2022-07-14 ENCOUNTER — Other Ambulatory Visit: Payer: Self-pay | Admitting: Nurse Practitioner

## 2022-07-14 DIAGNOSIS — N528 Other male erectile dysfunction: Secondary | ICD-10-CM

## 2022-07-14 NOTE — Telephone Encounter (Signed)
Requested medication (s) are due for refill today:   Yes  Requested medication (s) are on the active medication list:   Yes  Future visit scheduled:   Yes   Last ordered: Viagra 12/28/2020 #10, 8 refills;   Valium 01/24/2022 #15, 0 refills  Non delegated refill   Requested Prescriptions  Pending Prescriptions Disp Refills   sildenafil (VIAGRA) 100 MG tablet [Pharmacy Med Name: SILDENAFIL 100 MG TABLET] 10 tablet 8    Sig: TAKE 100 MG (1 TABLET) ONE HOUR PRIOR TO SEXUAL ACTIVITY. MAX DOSE ONCE DAILY. SEEK HELP FOR ERECTION LASTING LONGER THAN FOUR HOURS.     Urology: Erectile Dysfunction Agents Passed - 07/14/2022  8:55 AM      Passed - AST in normal range and within 360 days    AST  Date Value Ref Range Status  01/02/2022 20 0 - 40 IU/L Final         Passed - ALT in normal range and within 360 days    ALT  Date Value Ref Range Status  01/02/2022 32 0 - 44 IU/L Final         Passed - Last BP in normal range    BP Readings from Last 1 Encounters:  01/02/22 105/69         Passed - Valid encounter within last 12 months    Recent Outpatient Visits           6 months ago Hypothyroidism, unspecified type   [facility] [place], Rutledge T, NP   1 year ago Acute right-sided low back pain with right-sided sciatica   [place], Barbaraann Faster, NP   1 year ago Pure hypercholesterolemia   [place], [place] T, NP   2 years ago Encounter for annual physical exam   [place], Henrine Screws T, NP   3 years ago Hypothyroidism, unspecified type   [place], Barbaraann Faster, NP       Future Appointments             In 5 months [place], Barbaraann Faster, NP [facility]             diazepam (VALIUM) 5 MG tablet [Pharmacy Med Name: DIAZEPAM 5 MG TABLET] 15 tablet 0    Sig: TAKE 1 TABLET BY MOUTH EVERY 12 HOURS AS NEEDED FOR ANXIETY.     Not Delegated - Psychiatry: Anxiolytics/Hypnotics 2  Failed - 07/14/2022  8:55 AM      Failed - This refill cannot be delegated      Failed - Valid encounter within last 6 months    Recent Outpatient Visits           6 months ago Hypothyroidism, unspecified type   [facility] [place], Jolene T, NP   1 year ago Acute right-sided low back pain with right-sided sciatica   [place], Barbaraann Faster, NP   1 year ago Pure hypercholesterolemia   [place], [place] T, NP   2 years ago Encounter for annual physical exam   [place], Henrine Screws T, NP   3 years ago Hypothyroidism, unspecified type   [place], Barbaraann Faster, NP       Future Appointments             In 5 months Cannady, Barbaraann Faster, NP [facility], [place] -  Urine Drug Screen completed in last 360 days      Passed - Patient is not pregnant

## 2022-07-26 DIAGNOSIS — G4733 Obstructive sleep apnea (adult) (pediatric): Secondary | ICD-10-CM | POA: Diagnosis not present

## 2022-07-26 DIAGNOSIS — Z9884 Bariatric surgery status: Secondary | ICD-10-CM | POA: Diagnosis not present

## 2022-08-08 DIAGNOSIS — R399 Unspecified symptoms and signs involving the genitourinary system: Secondary | ICD-10-CM | POA: Diagnosis not present

## 2022-11-01 DIAGNOSIS — D2261 Melanocytic nevi of right upper limb, including shoulder: Secondary | ICD-10-CM | POA: Diagnosis not present

## 2022-11-01 DIAGNOSIS — L570 Actinic keratosis: Secondary | ICD-10-CM | POA: Diagnosis not present

## 2022-11-01 DIAGNOSIS — D2262 Melanocytic nevi of left upper limb, including shoulder: Secondary | ICD-10-CM | POA: Diagnosis not present

## 2022-11-01 DIAGNOSIS — L853 Xerosis cutis: Secondary | ICD-10-CM | POA: Diagnosis not present

## 2022-11-01 DIAGNOSIS — D225 Melanocytic nevi of trunk: Secondary | ICD-10-CM | POA: Diagnosis not present

## 2023-01-04 ENCOUNTER — Encounter: Payer: BC Managed Care – PPO | Admitting: Nurse Practitioner

## 2023-01-04 DIAGNOSIS — Z0000 Encounter for general adult medical examination without abnormal findings: Secondary | ICD-10-CM

## 2023-01-04 DIAGNOSIS — Z79899 Other long term (current) drug therapy: Secondary | ICD-10-CM

## 2023-01-04 DIAGNOSIS — F411 Generalized anxiety disorder: Secondary | ICD-10-CM

## 2023-01-04 DIAGNOSIS — E7800 Pure hypercholesterolemia, unspecified: Secondary | ICD-10-CM

## 2023-01-04 DIAGNOSIS — E039 Hypothyroidism, unspecified: Secondary | ICD-10-CM

## 2023-01-04 DIAGNOSIS — N400 Enlarged prostate without lower urinary tract symptoms: Secondary | ICD-10-CM

## 2023-01-04 DIAGNOSIS — Z6827 Body mass index (BMI) 27.0-27.9, adult: Secondary | ICD-10-CM

## 2023-01-27 NOTE — Patient Instructions (Signed)
Be Involved in Caring For Your Health:  Taking Medications When medications are taken as directed, they can greatly improve your health. But if they are not taken as prescribed, they may not work. In some cases, not taking them correctly can be harmful. To help ensure your treatment remains effective and safe, understand your medications and how to take them. Bring your medications to each visit for review by your provider.  Your lab results, notes, and after visit summary will be available on My Chart. We strongly encourage you to use this feature. If lab results are abnormal the clinic will contact you with the appropriate steps. If the clinic does not contact you assume the results are satisfactory. You can always view your results on My Chart. If you have questions regarding your health or results, please contact the clinic during office hours. You can also ask questions on My Chart.  We at [facility] are grateful that you chose [place] to provide your care. We strive to provide evidence-based and compassionate care and are always looking for feedback. If you get a survey from the clinic please complete this so we can hear your opinions.  Healthy Eating, Adult Healthy eating may help you get and keep a healthy body weight, reduce the risk of chronic disease, and live a long and productive life. It is important to follow a healthy eating pattern. Your nutritional and calorie needs should be met mainly by different nutrient-rich foods. What are tips for following this plan? Reading food labels Read labels and choose the following: Reduced or low sodium products. Juices with 100% fruit juice. Foods with low saturated fats (<3 g per serving) and high polyunsaturated and monounsaturated fats. Foods with whole grains, such as whole wheat, cracked wheat, brown rice, and wild rice. Whole grains that are fortified with folic acid. This is recommended for females who are pregnant or who want to  become pregnant. Read labels and do not eat or drink the following: Foods or drinks with added sugars. These include foods that contain brown sugar, corn sweetener, corn syrup, dextrose, fructose, glucose, high-fructose corn syrup, honey, invert sugar, lactose, malt syrup, maltose, molasses, raw sugar, sucrose, trehalose, or turbinado sugar. Limit your intake of added sugars to less than 10% of your total daily calories. Do not eat more than the following amounts of added sugar per day: 6 teaspoons (25 g) for females. 9 teaspoons (38 g) for males. Foods that contain processed or refined starches and grains. Refined grain products, such as white flour, degermed cornmeal, white bread, and white rice. Shopping Choose nutrient-rich snacks, such as vegetables, whole fruits, and nuts. Avoid high-calorie and high-sugar snacks, such as potato chips, fruit snacks, and candy. Use oil-based dressings and spreads on foods instead of solid fats such as butter, margarine, sour cream, or cream cheese. Limit pre-made sauces, mixes, and "instant" products such as flavored rice, instant noodles, and ready-made pasta. Try more plant-protein sources, such as tofu, tempeh, black beans, edamame, lentils, nuts, and seeds. Explore eating plans such as the Mediterranean diet or vegetarian diet. Try heart-healthy dips made with beans and healthy fats like hummus and guacamole. Vegetables go great with these. Cooking Use oil to saut or stir-fry foods instead of solid fats such as butter, margarine, or lard. Try baking, boiling, grilling, or broiling instead of frying. Remove the fatty part of meats before cooking. Steam vegetables in water or broth. Meal planning  At meals, imagine dividing your plate into fourths: One-half of  your plate is fruits and vegetables. One-fourth of your plate is whole grains. One-fourth of your plate is protein, especially lean meats, poultry, eggs, tofu, beans, or nuts. Include low-fat  dairy as part of your daily diet. Lifestyle Choose healthy options in all settings, including home, work, school, restaurants, or stores. Prepare your food safely: Wash your hands after handling raw meats. Where you prepare food, keep surfaces clean by regularly washing with hot, soapy water. Keep raw meats separate from ready-to-eat foods, such as fruits and vegetables. Cook seafood, meat, poultry, and eggs to the recommended temperature. Get a food thermometer. Store foods at safe temperatures. In general: Keep cold foods at 48F (4.4C) or below. Keep hot foods at 148F (60C) or above. Keep your freezer at [facility] (-17.8C) or below. Foods are not safe to eat if they have been between the temperatures of 40-148F (4.4-60C) for more than 2 hours. What foods should I eat? Fruits Aim to eat 1-2 cups of fresh, canned (in natural juice), or frozen fruits each day. One cup of fruit equals 1 small apple, 1 large banana, 8 large strawberries, 1 cup (237 g) canned fruit,  cup (82 g) dried fruit, or 1 cup (240 mL) 100% juice. Vegetables Aim to eat 2-4 cups of fresh and frozen vegetables each day, including different varieties and colors. One cup of vegetables equals 1 cup (91 g) broccoli or cauliflower florets, 2 medium carrots, 2 cups (150 g) raw, leafy greens, 1 large tomato, 1 large bell pepper, 1 large sweet potato, or 1 medium white potato. Grains Aim to eat 5-10 ounce-equivalents of whole grains each day. Examples of 1 ounce-equivalent of grains include 1 slice of bread, 1 cup (40 g) ready-to-eat cereal, 3 cups (24 g) popcorn, or  cup (93 g) cooked rice. Meats and other proteins Try to eat 5-7 ounce-equivalents of protein each day. Examples of 1 ounce-equivalent of protein include 1 egg,  oz nuts (12 almonds, 24 pistachios, or 7 walnut halves), 1/4 cup (90 g) cooked beans, 6 tablespoons (90 g) hummus or 1 tablespoon (16 g) peanut butter. A cut of meat or fish that is the size of a deck of  cards is about 3-4 ounce-equivalents (85 g). Of the protein you eat each week, try to have at least 8 sounce (227 g) of seafood. This is about 2 servings per week. This includes salmon, trout, herring, sardines, and anchovies. Dairy Aim to eat 3 cup-equivalents of fat-free or low-fat dairy each day. Examples of 1 cup-equivalent of dairy include 1 cup (240 mL) milk, 8 ounces (250 g) yogurt, 1 ounces (44 g) natural cheese, or 1 cup (240 mL) fortified soy milk. Fats and oils Aim for about 5 teaspoons (21 g) of fats and oils per day. Choose monounsaturated fats, such as canola and olive oils, mayonnaise made with olive oil or avocado oil, avocados, peanut butter, and most nuts, or polyunsaturated fats, such as sunflower, corn, and soybean oils, walnuts, pine nuts, sesame seeds, sunflower seeds, and flaxseed. Beverages Aim for 6 eight-ounce glasses of water per day. Limit coffee to 3-5 eight-ounce cups per day. Limit caffeinated beverages that have added calories, such as soda and energy drinks. If you drink alcohol: Limit how much you have to: 0-1 drink a day if you are male. 0-2 drinks a day if you are male. Know how much alcohol is in your drink. In the [place] one drink is one 12 oz bottle of beer (355 mL), one 5 oz glass of wine (  148 mL), or one 1 oz glass of hard liquor (44 mL). Seasoning and other foods Try not to add too much salt to your food. Try using herbs and spices instead of salt. Try not to add sugar to food. This information is based on [place] nutrition guidelines. To learn more, visit [employer]. Exact amounts may vary. You may need different amounts. This information is not intended to replace advice given to you by your health care provider. Make sure you discuss any questions you have with your health care provider. Document Revised: 03/27/2022 Document Reviewed: 03/27/2022 Elsevier Patient Education  2024 [employer].

## 2023-01-29 ENCOUNTER — Ambulatory Visit (INDEPENDENT_AMBULATORY_CARE_PROVIDER_SITE_OTHER): Payer: BC Managed Care – PPO | Admitting: Nurse Practitioner

## 2023-01-29 ENCOUNTER — Encounter: Payer: Self-pay | Admitting: Nurse Practitioner

## 2023-01-29 VITALS — BP 114/75 | HR 71 | Temp 98.5°F | Ht 70.75 in | Wt 189.0 lb

## 2023-01-29 DIAGNOSIS — N528 Other male erectile dysfunction: Secondary | ICD-10-CM

## 2023-01-29 DIAGNOSIS — Z79899 Other long term (current) drug therapy: Secondary | ICD-10-CM

## 2023-01-29 DIAGNOSIS — Z0000 Encounter for general adult medical examination without abnormal findings: Secondary | ICD-10-CM | POA: Diagnosis not present

## 2023-01-29 DIAGNOSIS — Z6827 Body mass index (BMI) 27.0-27.9, adult: Secondary | ICD-10-CM

## 2023-01-29 DIAGNOSIS — E7800 Pure hypercholesterolemia, unspecified: Secondary | ICD-10-CM

## 2023-01-29 DIAGNOSIS — F411 Generalized anxiety disorder: Secondary | ICD-10-CM

## 2023-01-29 DIAGNOSIS — E039 Hypothyroidism, unspecified: Secondary | ICD-10-CM | POA: Diagnosis not present

## 2023-01-29 DIAGNOSIS — M25521 Pain in right elbow: Secondary | ICD-10-CM

## 2023-01-29 DIAGNOSIS — H5789 Other specified disorders of eye and adnexa: Secondary | ICD-10-CM

## 2023-01-29 MED ORDER — PREDNISONE 10 MG PO TABS: ORAL_TABLET | ORAL | 0 refills | Status: DC

## 2023-01-29 MED ORDER — MELOXICAM 15 MG PO TABS: 15.0000 mg | ORAL_TABLET | Freq: Every day | ORAL | 0 refills | Status: DC

## 2023-01-29 MED ORDER — ALPRAZOLAM 1 MG PO TABS: 1.0000 mg | ORAL_TABLET | Freq: Every day | ORAL | 0 refills | Status: DC | PRN

## 2023-01-29 NOTE — Assessment & Plan Note (Signed)
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  At this time will trial a Prednisone taper and recommend use of Voltaren gel to area + use of support strap.  If worsening or ongoing to reach out to office and will schedule for elbow injection.

## 2023-01-29 NOTE — Assessment & Plan Note (Signed)
Condition is well controlled.  Encouraged outgoing adherence to medications. 100 mg viagra refilled recently. Follow-up in one year.  PSA today.

## 2023-01-29 NOTE — Assessment & Plan Note (Signed)
No current medications.   Last TSH within normal range.  Recheck TSH and Free T4 today, initiate medication as needed.  When he did take, he did not take medication consistently. His thyroid antibody is negative.

## 2023-01-29 NOTE — Assessment & Plan Note (Addendum)
Has been on Valium for several years and uses sparingly.  Started by previous PCP as he did not tolerate alternate regimens.  He would like to change to Xanax.  Will switch to Xanax 1 MG as needed daily, to continue to use sparingly and not daily, which we discussed.  Is aware of risks with long term use.

## 2023-01-29 NOTE — Assessment & Plan Note (Addendum)
Chronic and ongoing. Has been on Valium for several years and uses sparingly.  Started by previous PCP as he did not tolerate alternate regimens.  He would like to change to Xanax.  Will switch to Xanax 1 MG as needed daily, to continue to use sparingly and not daily, which we discussed.  Is aware of risks with long term use.  UDS and controlled agreement signed today, discussed at length with patient.

## 2023-01-29 NOTE — Assessment & Plan Note (Signed)
BMI 26.55 at this time.  Recommended eating smaller high protein, low fat meals more frequently and exercising 30 mins a day 5 times a week with a goal of 10-15lb weight loss in the next 3 months. Patient voiced their understanding and motivation to adhere to these recommendations.

## 2023-01-29 NOTE — Assessment & Plan Note (Signed)
No foreign bodies noted and no red flags seen on exam.  Suspect more irritation, possible allergy related.  Recommend obtaining OTC Pataday and monitor, if worsening or ongoing return to office.

## 2023-01-29 NOTE — Assessment & Plan Note (Signed)
ACSCVD 4.9%.  Recommend continued diet focus.  Lipid panel and CMP today.

## 2023-01-29 NOTE — Progress Notes (Signed)
BP 114/75   Pulse 71   Temp 98.5 F (36.9 C) (Oral)   Ht 5' 10.75" (1.797 m)   Wt 189 lb (85.7 kg)   SpO2 98%   BMI 26.55 kg/m    Subjective:    Patient ID: Sean Campbell, male    DOB: [birth year], 52 y.o.   MRN: [id number]  HPI: Sean Campbell is a 52 y.o. male presenting on 01/29/2023 for comprehensive medical examination. Current medical complaints include:none  He currently lives with: Interim Problems from his last visit: no  Would like left eye looked at as has had redness for 3 days, banged head across top Friday and then Saturday eye was red and swollen.  Does not have allergies that he is aware of.  ARM PAIN Present for two weeks.  No recent trauma. Duration: weeks Location: upper arm posterior Mechanism of injury: unknown Onset: sudden Severity: 6/10  Quality:  dull, aching, and throbbing, tight pain Frequency: intermittent Radiation: no Aggravating factors: weight bearing and movement  Alleviating factors:  Lidocaine rub, APAP, NSAIDs, and rest  Status: fluctuating Treatments attempted: rest, APAP, and ibuprofen , Lidocaine rub Relief with NSAIDs?:  no Swelling: no Redness: no  Warmth: no Trauma: no Chest pain: no  Shortness of breath: no  Fever: no Decreased sensation: no Paresthesias: no Weakness: no   HYPERLIPIDEMIA Past cholesterol meds: Atorvastatin, Rosuvastatin Supplements: none Aspirin:  no The 10-year ASCVD risk score (Arnett DK, et al., 2019) is: 4.9%   Values used to calculate the score:     Age: 34 years     Sex: Male     Is Non-Hispanic African American: No     Diabetic: No     Tobacco smoker: No     Systolic Blood Pressure: 114 mmHg     Is BP treated: No     HDL Cholesterol: 48 mg/dL     Total Cholesterol: 221 mg/dL Chest pain:  no Coronary artery disease:  no Family history CAD:  yes, uncles, dad, grandfather -- MI Family history early CAD:  no    HYPOTHYROIDISM No current medications. Thyroid control status:stable Etiology of  hypothyroidism: unknown Fatigue: no Cold intolerance: no Heat intolerance: no Weight gain: no Weight loss: no Constipation: no Diarrhea/loose stools: no Palpitations: no Lower extremity edema: no Anxiety/depressed mood: no   ANXIETY/STRESS Occasional Valium use, initially prescribed by previous PCP.  Rarely uses with 15 tablets lasting 6 or more months.  Pt is aware of risks of benzo medication use to include increased sedation, respiratory suppression, falls, dependence and cardiovascular events + some research notes higher risk for dementia.  Pt would like to continue treatment as benefit determined to outweigh risk.   Last fill 07/14/22.  He would like to stop Valium and switch to Xanax, which his wife takes and he prefers. Duration:stable Anxious mood: yes  Excessive worrying: yes Irritability: yes  Sweating: no Nausea: no Palpitations:no Hyperventilation: no Panic attacks: no Agoraphobia: no  Obscessions/compulsions: no Depressed mood: no    01/29/2023    8:15 AM 01/02/2022    8:06 AM 12/28/2020    8:28 AM 12/25/2019    9:20 AM 05/09/2018    9:09 AM  Depression screen PHQ 2/9  Decreased Interest 3 1 0 2 0  Down, Depressed, Hopeless 1 0 0 0 0  PHQ - 2 Score 4 1 0 2 0  Altered sleeping 1 1 0 0 3  Tired, decreased energy 3 2 0 1 3  Change in appetite 2  0 0 0 0  Feeling bad or failure about yourself  1 0 0 0 0  Trouble concentrating 2 0 0 2 0  Moving slowly or fidgety/restless 1 0 0 0 0  Suicidal thoughts 0 0 0 0 0  PHQ-9 Score 14 4 0 5 6  Difficult doing work/chores Somewhat difficult Not difficult at all Not difficult at all Not difficult at all   Anhedonia: yes Weight changes: no Insomnia: none Hypersomnia: no Fatigue/loss of energy: yes Feelings of worthlessness: no Feelings of guilt: no Impaired concentration/indecisiveness: no Suicidal ideations: no  Crying spells: no Recent Stressors/Life Changes: no   Relationship problems: no   Family stress: no      Financial stress: no    Job stress: no    Recent death/loss: no     02/17/2023    8:16 AM 01/02/2022    8:07 AM 12/28/2020    8:11 AM 12/25/2019    9:20 AM  GAD 7 : Generalized Anxiety Score  Nervous, Anxious, on Edge 2 1 0 1  Control/stop worrying 1 1 0 0  Worry too much - different things 1 1 0 1  Trouble relaxing 1 1 0 1  Restless 0 0 0 0  Easily annoyed or irritable 1 1 0 1  Afraid - awful might happen 0 0 0 0  Total GAD 7 Score 6 5 0 4  Anxiety Difficulty Somewhat difficult Not difficult at all  Not difficult at all   Functional Status Survey: Is the patient deaf or have difficulty hearing?: No Does the patient have difficulty seeing, even when wearing glasses/contacts?: No Does the patient have difficulty concentrating, remembering, or making decisions?: No Does the patient have difficulty walking or climbing stairs?: No Does the patient have difficulty dressing or bathing?: No Does the patient have difficulty doing errands alone such as visiting a doctor's office or shopping?: No  FALL RISK:    2023-02-17    8:15 AM 01/02/2022    8:06 AM 12/28/2020    8:10 AM 05/09/2018    9:09 AM 10/30/2017    3:01 PM  Fall Risk   Falls in the past year? 0 0 0 No No  Number falls in past yr: 0 0 0    Injury with Fall? 0 0 0    Risk for fall due to : No Fall Risks No Fall Risks No Fall Risks    Follow up Falls evaluation completed Falls evaluation completed Falls evaluation completed     Advanced Directives Does patient have a HCPOA?    yes If yes, name and contact information:  Does patient have a living will or MOST form?  yes  Past Medical History:  Past Medical History:  Diagnosis Date   Anxiety    Hyperlipemia    Hyperthyroidism     Surgical History:  Past Surgical History:  Procedure Laterality Date   COLONOSCOPY WITH PROPOFOL N/A 01/02/2018   Procedure: COLONOSCOPY WITH PROPOFOL;  Surgeon: Pasty Spillers, MD;  Location: [facility] ENDOSCOPY;  Service: Endoscopy;   Laterality: N/A;    Medications:  Current Outpatient Medications on File Prior to Visit  Medication Sig   sildenafil (VIAGRA) 100 MG tablet TAKE 100 MG (1 TABLET) ONE HOUR PRIOR TO SEXUAL ACTIVITY. MAX DOSE ONCE DAILY. SEEK HELP FOR ERECTION LASTING LONGER THAN FOUR HOURS.   No current facility-administered medications on file prior to visit.    Allergies:  No Known Allergies  Social History:  Social History   Socioeconomic  History   Marital status: Married    Spouse name: Not on file   Number of children: Not on file   Years of education: Not on file   Highest education level: Not on file  Occupational History   Not on file  Tobacco Use   Smoking status: Former   Smokeless tobacco: Never  Vaping Use   Vaping status: Never Used  Substance and Sexual Activity   Alcohol use: Yes    Alcohol/week: 8.0 standard drinks of alcohol    Types: 8 Cans of beer per week    Comment: 6 pack per week   Drug use: No   Sexual activity: Yes  Other Topics Concern   Not on file  Social History Narrative   Not on file   Social Determinants of Health   Financial Resource Strain: Not on file  Food Insecurity: Not on file  Transportation Needs: Not on file  Physical Activity: Not on file  Stress: Not on file  Social Connections: Not on file  Intimate Partner Violence: Not on file   Social History   Tobacco Use  Smoking Status Former  Smokeless Tobacco Never   Social History   Substance and Sexual Activity  Alcohol Use Yes   Alcohol/week: 8.0 standard drinks of alcohol   Types: 8 Cans of beer per week   Comment: 6 pack per week    Family History:  Family History  Problem Relation Age of Onset   Heart disease Father    Cancer Maternal Grandmother        colon   Heart disease Paternal Grandmother    Heart disease Paternal Grandfather     Past medical history, surgical history, medications, allergies, family history and social history reviewed with patient today and  changes made to appropriate areas of the chart.   ROS All other ROS negative except what is listed above and in the HPI.      Objective:    BP 114/75   Pulse 71   Temp 98.5 F (36.9 C) (Oral)   Ht 5' 10.75" (1.797 m)   Wt 189 lb (85.7 kg)   SpO2 98%   BMI 26.55 kg/m   Wt Readings from Last 3 Encounters:  01/29/23 189 lb (85.7 kg)  01/02/22 188 lb 3.2 oz (85.4 kg)  03/28/21 187 lb 3.2 oz (84.9 kg)     Physical Exam Vitals and nursing note reviewed.  Constitutional:      General: He is awake. He is not in acute distress.    Appearance: He is well-developed and well-groomed. He is not ill-appearing or toxic-appearing.  HENT:     Head: Normocephalic and atraumatic.     Right Ear: Hearing, tympanic membrane, ear canal and external ear normal. No drainage.     Left Ear: Hearing, tympanic membrane, ear canal and external ear normal. No drainage.     Nose: Nose normal.     Mouth/Throat:     Pharynx: Uvula midline.  Eyes:     General: Lids are normal. Lids are everted, no foreign bodies appreciated. No allergic shiner or visual field deficit.       Right eye: No discharge.        Left eye: No discharge.     Extraocular Movements: Extraocular movements intact.     Conjunctiva/sclera:     Right eye: Right conjunctiva is not injected.     Left eye: Left conjunctiva is injected (mild to lateral outside aspect).  Pupils: Pupils are equal, round, and reactive to light.     Visual Fields: Right eye visual fields normal and left eye visual fields normal.  Neck:     Thyroid: No thyromegaly.     Vascular: No carotid bruit or JVD.     Trachea: Trachea normal.  Cardiovascular:     Rate and Rhythm: Normal rate and regular rhythm.     Heart sounds: Normal heart sounds, S1 normal and S2 normal. No murmur heard.    No gallop.  Pulmonary:     Effort: Pulmonary effort is normal. No accessory muscle usage or respiratory distress.     Breath sounds: Normal breath sounds.  Abdominal:      General: Bowel sounds are normal.     Palpations: Abdomen is soft. There is no hepatomegaly or splenomegaly.     Tenderness: There is no abdominal tenderness.  Musculoskeletal:        General: Normal range of motion.     Right elbow: No swelling or effusion. Normal range of motion. Tenderness present in lateral epicondyle.     Left elbow: Normal.     Cervical back: Normal range of motion and neck supple.     Right lower leg: No edema.     Left lower leg: No edema.  Lymphadenopathy:     Head:     Right side of head: No submental, submandibular, tonsillar, preauricular or posterior auricular adenopathy.     Left side of head: No submental, submandibular, tonsillar, preauricular or posterior auricular adenopathy.     Cervical: No cervical adenopathy.  Skin:    General: Skin is warm and dry.     Capillary Refill: Capillary refill takes less than 2 seconds.     Findings: No rash.  Neurological:     Mental Status: He is alert and oriented to person, place, and time.     Gait: Gait is intact.     Deep Tendon Reflexes: Reflexes are normal and symmetric.     Reflex Scores:      Brachioradialis reflexes are 2+ on the right side and 2+ on the left side.      Patellar reflexes are 2+ on the right side and 2+ on the left side. Psychiatric:        Attention and Perception: Attention normal.        Mood and Affect: Mood normal.        Speech: Speech normal.        Behavior: Behavior normal. Behavior is cooperative.        Thought Content: Thought content normal.        Cognition and Memory: Cognition normal.    Results for orders placed or performed in visit on 01/02/22  CBC with Differential/Platelet  Result Value Ref Range   WBC 5.6 3.4 - 10.8 x10E3/uL   RBC 4.90 4.14 - 5.80 x10E6/uL   Hemoglobin 15.8 13.0 - 17.7 g/dL   Hematocrit 98.1 19.1 - 51.0 %   MCV 91 79 - 97 fL   MCH 32.2 26.6 - 33.0 pg   MCHC 35.3 31.5 - 35.7 g/dL   RDW 47.8 29.5 - 62.1 %   Platelets 212 150 - 450 x10E3/uL    Neutrophils 55 Not Estab. %   Lymphs 27 Not Estab. %   Monocytes 9 Not Estab. %   Eos 8 Not Estab. %   Basos 1 Not Estab. %   Neutrophils Absolute 3.1 1.4 - 7.0 x10E3/uL   Lymphocytes Absolute 1.5  0.7 - 3.1 x10E3/uL   Monocytes Absolute 0.5 0.1 - 0.9 x10E3/uL   EOS (ABSOLUTE) 0.4 0.0 - 0.4 x10E3/uL   Basophils Absolute 0.1 0.0 - 0.2 x10E3/uL   Immature Granulocytes 0 Not Estab. %   Immature Grans (Abs) 0.0 0.0 - 0.1 x10E3/uL  Comprehensive metabolic panel  Result Value Ref Range   Glucose 100 (H) 70 - 99 mg/dL   BUN 14 6 - 24 mg/dL   Creatinine, Ser 1.61 0.76 - 1.27 mg/dL   eGFR 90 >09 UE/AVW/0.98   BUN/Creatinine Ratio 14 9 - 20   Sodium 141 134 - 144 mmol/L   Potassium 4.4 3.5 - 5.2 mmol/L   Chloride 104 96 - 106 mmol/L   CO2 23 20 - 29 mmol/L   Calcium 9.2 8.7 - 10.2 mg/dL   Total Protein 6.8 6.0 - 8.5 g/dL   Albumin 4.6 3.8 - 4.9 g/dL   Globulin, Total 2.2 1.5 - 4.5 g/dL   Albumin/Globulin Ratio 2.1 1.2 - 2.2   Bilirubin Total 0.6 0.0 - 1.2 mg/dL   Alkaline Phosphatase 54 44 - 121 IU/L   AST 20 0 - 40 IU/L   ALT 32 0 - 44 IU/L  Lipid Panel w/o Chol/HDL Ratio  Result Value Ref Range   Cholesterol, Total 221 (H) 100 - 199 mg/dL   Triglycerides 119 0 - 149 mg/dL   HDL 48 >14 mg/dL   VLDL Cholesterol Cal 26 5 - 40 mg/dL   LDL Chol Calc (NIH) 782 (H) 0 - 99 mg/dL  TSH  Result Value Ref Range   TSH 3.150 0.450 - 4.500 uIU/mL  [id number] 11+Oxyco+Alc+Crt-Bund  Result Value Ref Range   Ethanol Negative Cutoff=0.020 %   Amphetamines, Urine Negative Cutoff=1000 ng/mL   Barbiturate Negative Cutoff=200 ng/mL   BENZODIAZ UR QL See Final Results Cutoff=200 ng/mL   Cannabinoid Quant, Ur Negative Cutoff=50 ng/mL   Cocaine (Metabolite) Negative Cutoff=300 ng/mL   OPIATE SCREEN URINE Negative Cutoff=300 ng/mL   Oxycodone/Oxymorphone, Urine See Final Results Cutoff=300 ng/mL   Phencyclidine Negative Cutoff=25 ng/mL   Methadone Screen, Urine Negative Cutoff=300 ng/mL    Propoxyphene Negative Cutoff=300 ng/mL   Meperidine Negative Cutoff=200 ng/mL   Tramadol Negative Cutoff=200 ng/mL   Creatinine 146.1 20.0 - 300.0 mg/dL   pH, Urine 5.5 4.5 - 8.9  PSA  Result Value Ref Range   Prostate Specific Ag, Serum 0.2 0.0 - 4.0 ng/mL  Thyroid peroxidase antibody  Result Value Ref Range   Thyroperoxidase Ab SerPl-aCnc <9 0 - 34 IU/mL  T4, free  Result Value Ref Range   Free T4 0.99 0.82 - 1.77 ng/dL  Benzodiazepines Confirm, Urine  Result Value Ref Range   Benzodiazepines Positive (A) Cutoff=200 ng/mL   Nordiazepam Negative Cutoff=100   Oxazepam Negative Cutoff=100   Flurazepam Negative Cutoff=100   Lorazepam Negative Cutoff=100   Alprazolam Positive (A)    Alprazolam Conf. 471 Cutoff=100 ng/mL   Clonazepam Negative Cutoff=100   Temazepam Negative Cutoff=100   Triazolam Negative Cutoff=100   Midazolam Negative Cutoff=100  Oxycodone/Oxymorphone, Confirm  Result Value Ref Range   OXYCODONE/OXYMORPH Positive (A) Cutoff=300   OXYCODONE Negative Cutoff=300   OXYMORPHONE Positive (A)    OXYMORPHONE (GC/MS) 392 Cutoff=300 ng/mL      Assessment & Plan:   Problem List Items Addressed This Visit       Endocrine   Hypothyroid    No current medications.   Last TSH within normal range.  Recheck TSH and Free T4 today, initiate medication as  needed.  When he did take, he did not take medication consistently. His thyroid antibody is negative.      Relevant Orders   CBC with Differential/Platelet   TSH   T4, free     Other   BMI 27.0-27.9,adult    BMI 26.55 at this time.  Recommended eating smaller high protein, low fat meals more frequently and exercising 30 mins a day 5 times a week with a goal of 10-15lb weight loss in the next 3 months. Patient voiced their understanding and motivation to adhere to these recommendations.       ED (erectile dysfunction)    Condition is well controlled.  Encouraged outgoing adherence to medications. 100 mg viagra  refilled recently. Follow-up in one year.  PSA today.      Relevant Orders   PSA   Generalized anxiety disorder    Chronic and ongoing. Has been on Valium for several years and uses sparingly.  Started by previous PCP as he did not tolerate alternate regimens.  He would like to change to Xanax.  Will switch to Xanax 1 MG as needed daily, to continue to use sparingly and not daily, which we discussed.  Is aware of risks with long term use.  UDS and controlled agreement signed today, discussed at length with patient.      Relevant Medications   ALPRAZolam (XANAX) 1 MG tablet   Other Relevant Orders   [id number] 11+Oxyco+Alc+Crt-Bund   Hyperlipidemia - Primary    ACSCVD 4.9%.  Recommend continued diet focus.  Lipid panel and CMP today.      Relevant Orders   Comprehensive metabolic panel   Lipid Panel w/o Chol/HDL Ratio   Long-term current use of benzodiazepine    Has been on Valium for several years and uses sparingly.  Started by previous PCP as he did not tolerate alternate regimens.  He would like to change to Xanax.  Will switch to Xanax 1 MG as needed daily, to continue to use sparingly and not daily, which we discussed.  Is aware of risks with long term use.      Relevant Orders   [id number] 11+Oxyco+Alc+Crt-Bund   Redness of left eye    No foreign bodies noted and no red flags seen on exam.  Suspect more irritation, possible allergy related.  Recommend obtaining OTC Pataday and monitor, if worsening or ongoing return to office.      Right elbow pain    For a couple months, suspect lateral epicondylitis.  At this time will trial a Prednisone taper and recommend use of Voltaren gel to area + use of support strap.  If worsening or ongoing to reach out to office and will schedule for elbow injection.      Other Visit Diagnoses     Encounter for annual physical exam       Annual physical today with labs and health maintenance reviewed, discussed with patient.       Discussed aspirin  prophylaxis for myocardial infarction prevention and decision was it was not indicated  LABORATORY TESTING:  Health maintenance labs ordered today as discussed above.   The natural history of prostate cancer and ongoing controversy regarding screening and potential treatment outcomes of prostate cancer has been discussed with the patient. The meaning of a false positive PSA and a false negative PSA has been discussed. He indicates understanding of the limitations of this screening test and wishes to proceed with screening PSA testing.   IMMUNIZATIONS:   - Tdap:  Tetanus vaccination status reviewed: Refused. - Influenza: Refused - Pneumovax: Not applicable - Prevnar: Not applicable - Zostavax vaccine: Up to date  SCREENING: - Colonoscopy: Up to date  Discussed with patient purpose of the colonoscopy is to detect colon cancer at curable precancerous or early stages   - AAA Screening: Not applicable  -Hearing Test: Not applicable  -Spirometry: Not applicable   PATIENT COUNSELING:    Sexuality: Discussed sexually transmitted diseases, partner selection, use of condoms, avoidance of unintended pregnancy  and contraceptive alternatives.   Advised to avoid cigarette smoking.  I discussed with the patient that most people either abstain from alcohol or drink within safe limits (<=14/week and <=4 drinks/occasion for males, <=7/weeks and <= 3 drinks/occasion for females) and that the risk for alcohol disorders and other health effects rises proportionally with the number of drinks per week and how often a drinker exceeds daily limits.  Discussed cessation/primary prevention of drug use and availability of treatment for abuse.   Diet: Encouraged to adjust caloric intake to maintain  or achieve ideal body weight, to reduce intake of dietary saturated fat and total fat, to limit sodium intake by avoiding high sodium foods and not adding table salt, and to maintain adequate dietary potassium and  calcium preferably from fresh fruits, vegetables, and low-fat dairy products.    Stressed the importance of regular exercise  Injury prevention: Discussed safety belts, safety helmets, smoke detector, smoking near bedding or upholstery.   Dental health: Discussed importance of regular tooth brushing, flossing, and dental visits.   Follow up plan: NEXT PREVENTATIVE PHYSICAL DUE IN 1 YEAR. Return in about 1 year (around 01/29/2024) for Annual Exam.

## 2023-01-30 LAB — CBC WITH DIFFERENTIAL/PLATELET
Basophils Absolute: 0.1 10*3/uL (ref 0.0–0.2)
Basos: 1 %
EOS (ABSOLUTE): 0.4 10*3/uL (ref 0.0–0.4)
Eos: 6 %
Hematocrit: 46.6 % (ref 37.5–51.0)
Hemoglobin: 16.3 g/dL (ref 13.0–17.7)
Immature Grans (Abs): 0.1 10*3/uL (ref 0.0–0.1)
Immature Granulocytes: 1 %
Lymphocytes Absolute: 1.5 10*3/uL (ref 0.7–3.1)
Lymphs: 25 %
MCH: 32.1 pg (ref 26.6–33.0)
MCHC: 35 g/dL (ref 31.5–35.7)
MCV: 92 fL (ref 79–97)
Monocytes Absolute: 0.5 10*3/uL (ref 0.1–0.9)
Monocytes: 8 %
Neutrophils Absolute: 3.6 10*3/uL (ref 1.4–7.0)
Neutrophils: 59 %
Platelets: 257 10*3/uL (ref 150–450)
RBC: 5.08 x10E6/uL (ref 4.14–5.80)
RDW: 12.9 % (ref 11.6–15.4)
WBC: 6.1 10*3/uL (ref 3.4–10.8)

## 2023-01-30 LAB — DRUG SCREEN 764883 11+OXYCO+ALC+CRT-BUND

## 2023-01-30 LAB — COMPREHENSIVE METABOLIC PANEL
ALT: 43 IU/L (ref 0–44)
AST: 21 IU/L (ref 0–40)
Albumin: 4.7 g/dL (ref 3.8–4.9)
Alkaline Phosphatase: 57 IU/L (ref 44–121)
BUN/Creatinine Ratio: 12 (ref 9–20)
BUN: 12 mg/dL (ref 6–24)
Bilirubin Total: 0.4 mg/dL (ref 0.0–1.2)
CO2: 24 mmol/L (ref 20–29)
Calcium: 9.5 mg/dL (ref 8.7–10.2)
Chloride: 104 mmol/L (ref 96–106)
Creatinine, Ser: 1.02 mg/dL (ref 0.76–1.27)
Globulin, Total: 2.3 g/dL (ref 1.5–4.5)
Glucose: 99 mg/dL (ref 70–99)
Potassium: 4.7 mmol/L (ref 3.5–5.2)
Sodium: 143 mmol/L (ref 134–144)
Total Protein: 7 g/dL (ref 6.0–8.5)
eGFR: 87 mL/min/{1.73_m2} (ref >59)

## 2023-01-30 LAB — LIPID PANEL W/O CHOL/HDL RATIO
Cholesterol, Total: 239 mg/dL — ABNORMAL HIGH (ref 100–199)
HDL: 52 mg/dL (ref >39)
LDL Chol Calc (NIH): 166 mg/dL — ABNORMAL HIGH (ref 0–99)
Triglycerides: 120 mg/dL (ref 0–149)
VLDL Cholesterol Cal: 21 mg/dL (ref 5–40)

## 2023-01-30 LAB — PSA: Prostate Specific Ag, Serum: 0.2 ng/mL (ref 0.0–4.0)

## 2023-01-30 LAB — TSH: TSH: 2.79 u[IU]/mL (ref 0.450–4.500)

## 2023-01-30 LAB — T4, FREE: Free T4: 1.02 ng/dL (ref 0.82–1.77)

## 2023-01-30 NOTE — Progress Notes (Signed)
Contacted via MyChart    Good afternoon Consuelo, your labs have returned: - CBC shows no anemia or infection - Kidney function, creatinine and eGFR, remains normal, as is liver function, AST and ALT.  - Thyroid labs remain normal, no medications needed.:) - Prostate lab remains normal - Cholesterol labs remain elevated, however risk score remains lower then 7% and LDL <190.  We can continue focus on diet and regular exercise at this time.  Any questions? The 10-year ASCVD risk score (Arnett DK, et al., 2019) is: 5%   Values used to calculate the score:     Age: 52 years     Sex: Male     Is Non-Hispanic African American: No     Diabetic: No     Tobacco smoker: No     Systolic Blood Pressure: 114 mmHg     Is BP treated: No     HDL Cholesterol: 52 mg/dL     Total Cholesterol: 239 mg/dL Keep being amazing!!  Thank you for allowing me to participate in your care.  I appreciate you. Kindest regards, Raiya Stainback

## 2023-03-16 ENCOUNTER — Ambulatory Visit (INDEPENDENT_AMBULATORY_CARE_PROVIDER_SITE_OTHER): Payer: BC Managed Care – PPO | Admitting: Nurse Practitioner

## 2023-03-16 ENCOUNTER — Encounter: Payer: Self-pay | Admitting: Nurse Practitioner

## 2023-03-16 VITALS — BP 121/70 | HR 74 | Temp 97.8°F | Wt 192.4 lb

## 2023-03-16 DIAGNOSIS — M25521 Pain in right elbow: Secondary | ICD-10-CM | POA: Diagnosis not present

## 2023-03-16 MED ORDER — HYDROCODONE-ACETAMINOPHEN 5-325 MG PO TABS: 1.0000 | ORAL_TABLET | ORAL | 0 refills | Status: AC | PRN

## 2023-03-16 NOTE — Patient Instructions (Signed)
Climber's Elbow  Climber's elbow is an elbow injury that results from long-lasting (chronic) inflammation or strain of the inner muscles in your upper arms (brachialis muscles). Your brachialis muscles help your arms bend (flex) at the elbow. This is a common overuse injury among climbers who constantly overstretch (overextend) or flex their arms over their heads. This condition is also called brachialis muscle strain or anterior capsule elbow sprain. This condition causes pain that gets worse over time and makes it difficult to straighten your arms. What are the causes? This condition is caused by chronic inflammation of the upper arm muscles from continuous overhead arm motion. This type of injury develops gradually over time (overuse injury) from repeated overextension or repetitive, forceful flexing of your upper arm muscles. This causes chronic inflammation in one or both muscles. Strained muscles may be twisted, pulled, or torn. The tissue that connects these muscles to bone (tendons) may also become inflamed (tendinitis). What increases the risk? You are more likely to develop this condition if you have a job or play a sport that requires constant overhead arm motions that involve repetitive flexing and extending (straightening) of the elbow. This includes being an athlete who: Climbs. Swims. Plays tennis, baseball, or bowling. Lifts weights. What are the signs or symptoms? Symptoms of this condition include: Pain in the upper arm and elbow. Arm pain that gets worse when flexing and straightening the elbow. Redness or warmth at the front of the elbow. A noise like a pop or a snap when moving or touching the elbow. Muscle spasms in the upper arm. Loss of arm strength or range of motion. How is this diagnosed? This condition is diagnosed based on: Your symptoms. Your medical history. A physical exam. During the exam, you may be asked to move your hand, fingers, wrist, and elbow in  certain ways to help your health care provider find the source of your injury. You may also have imaging studies to confirm the diagnosis and to find out more about your condition, such as: X-rays to check for broken bones. An MRI to check for tears in the ligaments, muscles, or tendons. How is this treated? This condition is treated by: Stopping or reducing all activities that cause arm or elbow pain until your symptoms go away. Icing the elbow to relieve pain. Taking NSAIDs to reduce pain and swelling. Doing exercises (physical therapy) as instructed by your health care provider. Follow these instructions at home: Managing pain, stiffness, and swelling  If directed, put ice on the injured area. Put ice in a plastic bag. Place a towel between your skin and the bag. Leave the ice on for 20 minutes, 2-3 times a day. Remove the ice if your skin turns bright red. This is very important. If you cannot feel pain, heat, or cold, you have a greater risk of damage to the area. Move your fingers often to reduce stiffness and swelling. Raise (elevate) the injured area above the level of your heart while you are sitting or lying down. Activity Rest as told by your health care provider. Do not lift anything that is heavier than the limit that you are told, until your health care provider says that it is safe. Return to your normal activities as told by your health care provider. Ask your health care provider what activities are safe for you. Do exercises as told by your health care provider. General instructions Take over-the-counter and prescription medicines only as told by your health care provider. Do not  use any products that contain nicotine or tobacco. These products include cigarettes, chewing tobacco, and vaping devices, such as e-cigarettes. These can delay healing. If you need help quitting, ask your health care provider. Keep all follow-up visits. This is important. How is this  prevented? Warm up and stretch before being active. Cool down and stretch after being active. Give your body time to rest between periods of activity. Make sure to use equipment that fits you. Maintain physical fitness, including: Strength. Flexibility. Cardiovascular fitness. Endurance. Contact a health care provider if: Your pain does not improve or it gets worse. Get help right away if: You have severe pain, swelling, or numbness in your elbow, arm, or hand. You cannot move your arm. Your elbow looks like it has the wrong shape (looks deformed) or feels like it is sliding out of place. Summary Climber's elbow is an elbow injury that results from long-lasting (chronic) inflammation or strain of the muscles in your upper arms (brachialis muscles). This type of injury develops gradually over time (overuse injury) from repeated overextension or repetitive, forceful flexing of your upper arm muscles. This condition is diagnosed based on your symptoms, your medical history, and a physical exam. Return to your normal activities as told by your health care provider. Ask your health care provider what activities are safe for you. This information is not intended to replace advice given to you by your health care provider. Make sure you discuss any questions you have with your health care provider. Document Revised: 02/26/2021 Document Reviewed: 02/26/2021 Elsevier Patient Education  2024 [employer].

## 2023-03-16 NOTE — Assessment & Plan Note (Signed)
Ongoing for two months with some worsening.  No benefit from Prednisone or Meloxicam + simple at home methods.  Will place referral to ortho due to worsening pain and some weakness with lifting reported.  Sent in 5 days of Norco 5-325 MG to assist with pain until he can see ortho for further recommendations.  All questions answered. ?climbers elbow.

## 2023-03-16 NOTE — Progress Notes (Signed)
BP 121/70   Pulse 74   Temp 97.8 F (36.6 C) (Oral)   Wt 192 lb 6.4 oz (87.3 kg)   SpO2 98%   BMI 27.03 kg/m    Subjective:    Patient ID: Sean Campbell, male    DOB: 01-26-1971, 52 y.o.   MRN: [id number]  HPI: Sean Campbell is a 52 y.o. male  Chief Complaint  Patient presents with   Elbow Pain    Pt states he has still been having ongoing elbow pain since visit in July. States he took all of the Prednisone and Meloxicam that was prescribed but it still having pain. States he does not feel the pain when he is sitting still but does feel the patient any time he moves his elbow.    ELBOW PAIN (RIGHT) Presents today for ongoing elbow pain since July.  No benefit from Prednisone and Meloxicam.  Feels like it is worse.  Has not been to gym in 3 months.  When moves elbow pain radiates from wrist to shoulder, starting in elbow.  Does lift weights at baseline. Duration: months Location: elbow Mechanism of injury: unknown Onset: sudden Severity: 7/10  Quality:  dull, aching, and shooting Frequency: intermittent Radiation: yes Aggravating factors: movement  Alleviating factors: nothing   Status: worse Treatments attempted: Lidocaine patches,  Prednisone, Meloxicam, Advil  Relief with NSAIDs?:  no Swelling: no Redness: no  Warmth: no Trauma: no Chest pain: no  Shortness of breath: no  Fever: no Decreased sensation: no Paresthesias: no Weakness: yes   Relevant past medical, surgical, family and social history reviewed and updated as indicated. Interim medical history since our last visit reviewed. Allergies and medications reviewed and updated.  Review of Systems  Constitutional:  Negative for activity change, diaphoresis, fatigue and fever.  Respiratory:  Negative for cough, chest tightness, shortness of breath and wheezing.   Cardiovascular:  Negative for chest pain, palpitations and leg swelling.  Musculoskeletal:  Positive for arthralgias. Negative for joint swelling.   Neurological: Negative.   Psychiatric/Behavioral: Negative.      Per HPI unless specifically indicated above     Objective:    BP 121/70   Pulse 74   Temp 97.8 F (36.6 C) (Oral)   Wt 192 lb 6.4 oz (87.3 kg)   SpO2 98%   BMI 27.03 kg/m   Wt Readings from Last 3 Encounters:  03/16/23 192 lb 6.4 oz (87.3 kg)  01/29/23 189 lb (85.7 kg)  01/02/22 188 lb 3.2 oz (85.4 kg)    Physical Exam Vitals and nursing note reviewed.  Constitutional:      General: He is awake. He is not in acute distress.    Appearance: He is well-developed and well-groomed. He is not ill-appearing or toxic-appearing.  HENT:     Head: Normocephalic.     Right Ear: Hearing and external ear normal.     Left Ear: Hearing and external ear normal.  Eyes:     General: Lids are normal.     Extraocular Movements: Extraocular movements intact.     Conjunctiva/sclera: Conjunctivae normal.  Neck:     Thyroid: No thyromegaly.     Vascular: No carotid bruit.  Cardiovascular:     Rate and Rhythm: Normal rate and regular rhythm.     Heart sounds: Normal heart sounds. No murmur heard.    No gallop.  Pulmonary:     Effort: No accessory muscle usage or respiratory distress.     Breath sounds: Normal breath sounds.  Abdominal:     General: Bowel sounds are normal. There is no distension.     Palpations: Abdomen is soft.     Tenderness: There is no abdominal tenderness.  Musculoskeletal:     Right shoulder: Normal.     Left shoulder: Normal.     Right upper arm: No swelling, tenderness or bony tenderness.     Left upper arm: Normal.     Right elbow: No swelling, effusion or lacerations. Normal range of motion. No tenderness.     Left elbow: Normal.     Right forearm: Tenderness present. No swelling or bony tenderness.     Left forearm: Normal.     Cervical back: Full passive range of motion without pain.     Right lower leg: No edema.     Left lower leg: No edema.     Comments: Tenderness along  brachioradialis area.  No tenderness at elbow.  Some weakness noted to arm.  Lymphadenopathy:     Cervical: No cervical adenopathy.  Skin:    General: Skin is warm.     Capillary Refill: Capillary refill takes less than 2 seconds.  Neurological:     Mental Status: He is alert and oriented to person, place, and time.     Deep Tendon Reflexes: Reflexes are normal and symmetric.     Reflex Scores:      Brachioradialis reflexes are 2+ on the right side and 2+ on the left side.      Patellar reflexes are 2+ on the right side and 2+ on the left side. Psychiatric:        Attention and Perception: Attention normal.        Mood and Affect: Mood normal.        Speech: Speech normal.        Behavior: Behavior normal. Behavior is cooperative.        Thought Content: Thought content normal.     Results for orders placed or performed in visit on 01/29/23  CBC with Differential/Platelet  Result Value Ref Range   WBC 6.1 3.4 - 10.8 x10E3/uL   RBC 5.08 4.14 - 5.80 x10E6/uL   Hemoglobin 16.3 13.0 - 17.7 g/dL   Hematocrit 11.9 14.7 - 51.0 %   MCV 92 79 - 97 fL   MCH 32.1 26.6 - 33.0 pg   MCHC 35.0 31.5 - 35.7 g/dL   RDW 82.9 56.2 - 13.0 %   Platelets 257 150 - 450 x10E3/uL   Neutrophils 59 Not Estab. %   Lymphs 25 Not Estab. %   Monocytes 8 Not Estab. %   Eos 6 Not Estab. %   Basos 1 Not Estab. %   Neutrophils Absolute 3.6 1.4 - 7.0 x10E3/uL   Lymphocytes Absolute 1.5 0.7 - 3.1 x10E3/uL   Monocytes Absolute 0.5 0.1 - 0.9 x10E3/uL   EOS (ABSOLUTE) 0.4 0.0 - 0.4 x10E3/uL   Basophils Absolute 0.1 0.0 - 0.2 x10E3/uL   Immature Granulocytes 1 Not Estab. %   Immature Grans (Abs) 0.1 0.0 - 0.1 x10E3/uL  Comprehensive metabolic panel  Result Value Ref Range   Glucose 99 70 - 99 mg/dL   BUN 12 6 - 24 mg/dL   Creatinine, Ser 8.65 0.76 - 1.27 mg/dL   eGFR 87 >78 IO/NGE/9.52   BUN/Creatinine Ratio 12 9 - 20   Sodium 143 134 - 144 mmol/L   Potassium 4.7 3.5 - 5.2 mmol/L   Chloride 104 96 - 106  mmol/L   CO2  24 20 - 29 mmol/L   Calcium 9.5 8.7 - 10.2 mg/dL   Total Protein 7.0 6.0 - 8.5 g/dL   Albumin 4.7 3.8 - 4.9 g/dL   Globulin, Total 2.3 1.5 - 4.5 g/dL   Bilirubin Total 0.4 0.0 - 1.2 mg/dL   Alkaline Phosphatase 57 44 - 121 IU/L   AST 21 0 - 40 IU/L   ALT 43 0 - 44 IU/L  Lipid Panel w/o Chol/HDL Ratio  Result Value Ref Range   Cholesterol, Total 239 (H) 100 - 199 mg/dL   Triglycerides 409 0 - 149 mg/dL   HDL 52 >81 mg/dL   VLDL Cholesterol Cal 21 5 - 40 mg/dL   LDL Chol Calc (NIH) 191 (H) 0 - 99 mg/dL  TSH  Result Value Ref Range   TSH 2.790 0.450 - 4.500 uIU/mL  T4, free  Result Value Ref Range   Free T4 1.02 0.82 - 1.77 ng/dL  PSA  Result Value Ref Range   Prostate Specific Ag, Serum 0.2 0.0 - 4.0 ng/mL  [id number] 11+Oxyco+Alc+Crt-Bund  Result Value Ref Range   Ethanol Negative Cutoff=0.020 %   Amphetamines, Urine Negative Cutoff=1000 ng/mL   Barbiturate Negative Cutoff=200 ng/mL   BENZODIAZ UR QL See Final Results Cutoff=200 ng/mL   Cannabinoid Quant, Ur Negative Cutoff=50 ng/mL   Cocaine (Metabolite) Negative Cutoff=300 ng/mL   OPIATE SCREEN URINE Negative Cutoff=300 ng/mL   Oxycodone/Oxymorphone, Urine Negative Cutoff=300 ng/mL   Phencyclidine Negative Cutoff=25 ng/mL   Methadone Screen, Urine Negative Cutoff=300 ng/mL   Propoxyphene Negative Cutoff=300 ng/mL   Meperidine Negative Cutoff=200 ng/mL   Tramadol Negative Cutoff=200 ng/mL   Creatinine 168.9 20.0 - 300.0 mg/dL   pH, Urine 5.7 4.5 - 8.9  Benzodiazepines Confirm, Urine  Result Value Ref Range   Benzodiazepines Positive (A) Cutoff=200 ng/mL   Nordiazepam Negative Cutoff=100   Oxazepam Negative Cutoff=100   Flurazepam Negative Cutoff=100   Lorazepam Negative Cutoff=100   Alprazolam Positive (A)    Alprazolam Conf. 812 Cutoff=100 ng/mL   Clonazepam Negative Cutoff=100   Temazepam Negative Cutoff=100   Triazolam Negative Cutoff=100   Midazolam Negative Cutoff=100      Assessment & Plan:    Problem List Items Addressed This Visit       Other   Right elbow pain - Primary    Ongoing for two months with some worsening.  No benefit from Prednisone or Meloxicam + simple at home methods.  Will place referral to ortho due to worsening pain and some weakness with lifting reported.  Sent in 5 days of Norco 5-325 MG to assist with pain until he can see ortho for further recommendations.  All questions answered. ?climbers elbow.      Relevant Orders   Ambulatory referral to Orthopedic Surgery     Follow up plan: Return if symptoms worsen or fail to improve.

## 2023-03-27 DIAGNOSIS — M7711 Lateral epicondylitis, right elbow: Secondary | ICD-10-CM | POA: Diagnosis not present

## 2023-04-18 ENCOUNTER — Encounter: Payer: Self-pay | Admitting: Nurse Practitioner

## 2023-07-14 ENCOUNTER — Encounter: Payer: Self-pay | Admitting: *Deleted

## 2023-07-14 ENCOUNTER — Ambulatory Visit
Admission: EM | Admit: 2023-07-14 | Discharge: 2023-07-14 | Disposition: A | Discharge status: Home / Self Care | Payer: BC Managed Care – PPO | Attending: Emergency Medicine | Admitting: Emergency Medicine

## 2023-07-14 DIAGNOSIS — H6122 Impacted cerumen, left ear: Secondary | ICD-10-CM

## 2023-07-14 NOTE — ED Provider Notes (Signed)
 [name]    CSN: [id number] Arrival date & time: 07/14/23  0934      History   Chief Complaint Chief Complaint  Patient presents with   Hearing Loss    HPI Sean Campbell is a 53 y.o. male.   Patient presents for evaluation of fullness and hearing loss in the left ear.  Attempted peroxide and water irrigation yesterday evening, successful to the right side only.  Denies drainage, fever, congestion.  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   BMI 27.0-27.9,adult 12/25/2019   History of smoking 01/24/2019   Hypothyroid 05/09/2018   Generalized anxiety disorder 05/09/2018   Polyp of sigmoid colon    Diverticulosis of large intestine without diverticulitis    Internal hemorrhoids    ED (erectile dysfunction) 07/17/2017   Hyperlipidemia 05/26/2013    Past Surgical History:  Procedure Laterality Date   COLONOSCOPY WITH PROPOFOL  N/A 01/02/2018   Procedure: COLONOSCOPY WITH PROPOFOL ;  Surgeon: Janalyn Keene NOVAK, MD;  Location: [facility] ENDOSCOPY;  Service: Endoscopy;  Laterality: N/A;       Home Medications    Prior to Admission medications   Medication Sig Start Date End Date Taking? Authorizing Provider  ALPRAZolam  (XANAX ) 1 MG tablet Take 1 tablet (1 mg total) by mouth daily as needed for anxiety (for severe anxiety only). 01/29/23  Yes Cannady, Jolene T, NP  sildenafil  (VIAGRA ) 100 MG tablet TAKE 100 MG (1 TABLET) ONE HOUR PRIOR TO SEXUAL ACTIVITY. MAX DOSE ONCE DAILY. SEEK HELP FOR ERECTION LASTING LONGER THAN FOUR HOURS. 07/14/22   Cannady, Jolene T, NP    Family History Family History  Problem Relation Age of Onset   Heart disease Father    Cancer Maternal Grandmother        colon   Heart disease  Paternal Grandmother    Heart disease Paternal Grandfather     Social History Social History   Tobacco Use   Smoking status: Former   Smokeless tobacco: Never  Vaping Use   Vaping status: Never Used  Substance Use Topics   Alcohol use: Yes    Alcohol/week: 6.0 standard drinks of alcohol    Types: 6 Cans of beer per week    Comment: 6 pack per week   Drug use: No     Allergies   Patient has no known allergies.   Review of Systems Review of Systems   Physical Exam Triage Vital Signs ED Triage Vitals  Encounter Vitals Group     BP 07/14/23 0959 114/69     Systolic BP Percentile --      Diastolic BP Percentile --      Pulse Rate 07/14/23 0959 75     Resp 07/14/23 0959 16     Temp 07/14/23 0959 98 F (36.7 C)     Temp Source 07/14/23 0959 Temporal     SpO2 07/14/23 0959 98 %     Weight --      Height --      Head Circumference --      Peak Flow --      Pain Score 07/14/23 1001 0     Pain Loc --      Pain Education --  Exclude from Growth Chart --    No data found.  Updated Vital Signs BP 114/69 (BP Location: Left Arm)   Pulse 75   Temp 98 F (36.7 C) (Temporal)   Resp 16   SpO2 98%   Visual Acuity Right Eye Distance:   Left Eye Distance:   Bilateral Distance:    Right Eye Near:   Left Eye Near:    Bilateral Near:     Physical Exam Constitutional:      Appearance: Normal appearance.  HENT:     Right Ear: Tympanic membrane, ear canal and external ear normal.     Left Ear: There is impacted cerumen.  Eyes:     Extraocular Movements: Extraocular movements intact.  Pulmonary:     Effort: Pulmonary effort is normal.  Neurological:     Mental Status: He is alert and oriented to person, place, and time. Mental status is at baseline.      UC Treatments / Results  Labs (all labs ordered are listed, but only abnormal results are displayed) Labs Reviewed - No data to display  EKG   Radiology No results found.  Procedures Procedures  (including critical care time)  Medications Ordered in UC Medications - No data to display  Initial Impression / Assessment and Plan / UC Course  I have reviewed the triage vital signs and the nursing notes.  Pertinent labs & imaging results that were available during my care of the patient were reviewed by me and considered in my medical decision making (see chart for details).  Impacted cerumen of left ear  Present on exam, discussed with patient, no abnormalities to the right side, water irrigation completed by nursing staff, successful, advise follow-up as needed Final Clinical Impressions(s) / UC Diagnoses   Final diagnoses:  Impacted cerumen of left ear     Discharge Instructions      Declined AVS  Today you were treated for ear fullness due to buildup of wax, ears have been irrigated with water  Moving forward you may use over-the-counter Debrox drops to help thin secretions making it easier to clean   may follow-up with his urgent care as needed if fullness recurs    ED Prescriptions   None    PDMP not reviewed this encounter.   Teresa Shelba SAUNDERS, NP 07/14/23 1052

## 2023-07-14 NOTE — ED Triage Notes (Addendum)
 C/O decreased hearing in left ear onset 3 days ago. Denies cold sxs.

## 2023-07-14 NOTE — Discharge Instructions (Addendum)
 Declined AVS  Today you were treated for ear fullness due to buildup of wax, ears have been irrigated with water  Moving forward you may use over-the-counter Debrox drops to help thin secretions making it easier to clean   may follow-up with his urgent care as needed if fullness recurs

## 2024-01-30 ENCOUNTER — Encounter: Payer: BC Managed Care – PPO | Admitting: Nurse Practitioner

## 2024-02-09 NOTE — Patient Instructions (Incomplete)

## 2024-02-13 ENCOUNTER — Encounter: Payer: Self-pay | Admitting: Nurse Practitioner

## 2024-02-13 DIAGNOSIS — Z0000 Encounter for general adult medical examination without abnormal findings: Secondary | ICD-10-CM

## 2024-02-13 DIAGNOSIS — N528 Other male erectile dysfunction: Secondary | ICD-10-CM

## 2024-02-13 DIAGNOSIS — Z79899 Other long term (current) drug therapy: Secondary | ICD-10-CM

## 2024-02-13 DIAGNOSIS — F411 Generalized anxiety disorder: Secondary | ICD-10-CM

## 2024-02-13 DIAGNOSIS — E7800 Pure hypercholesterolemia, unspecified: Secondary | ICD-10-CM

## 2024-02-13 DIAGNOSIS — E039 Hypothyroidism, unspecified: Secondary | ICD-10-CM

## 2024-02-13 DIAGNOSIS — Z6827 Body mass index (BMI) 27.0-27.9, adult: Secondary | ICD-10-CM

## 2024-02-19 ENCOUNTER — Other Ambulatory Visit: Payer: Self-pay | Admitting: Nurse Practitioner

## 2024-02-19 DIAGNOSIS — N528 Other male erectile dysfunction: Secondary | ICD-10-CM

## 2024-02-22 NOTE — Telephone Encounter (Signed)
 Requested medication (s) are due for refill today: yes  Requested medication (s) are on the active medication list: yes  Last refill:  07/14/22  Future visit scheduled: yes  Notes to clinic:  routing for approval, last OV Sept. 2024, Next OV 04/15/24     Requested Prescriptions  Pending Prescriptions Disp Refills   sildenafil (VIAGRA) 100 MG tablet [Pharmacy Med Name: SILDENAFIL 100 MG TABLET] 30 tablet 2    Sig: TAKE 100 MG (1 TABLET) ONE HOUR PRIOR TO SEXUAL ACTIVITY. MAX DOSE ONCE DAILY. SEEK HELP FOR ERECTION LASTING LONGER THAN FOUR HOURS.     Urology: Erectile Dysfunction Agents Failed - 02/22/2024  9:02 AM      Failed - AST in normal range and within 360 days    AST  Date Value Ref Range Status  01/29/2023 21 0 - 40 IU/L Final         Failed - ALT in normal range and within 360 days    ALT  Date Value Ref Range Status  01/29/2023 43 0 - 44 IU/L Final         Failed - Valid encounter within last 12 months    Recent Outpatient Visits   None     Future Appointments             In 1 month Cannady, Jolene T, NP [facility]            Passed - Last BP in normal range    BP Readings from Last 1 Encounters:  07/14/23 114/69

## 2024-03-21 DIAGNOSIS — K912 Postsurgical malabsorption, not elsewhere classified: Secondary | ICD-10-CM | POA: Diagnosis not present

## 2024-03-21 DIAGNOSIS — L987 Excessive and redundant skin and subcutaneous tissue: Secondary | ICD-10-CM | POA: Diagnosis not present

## 2024-03-21 DIAGNOSIS — Z903 Acquired absence of stomach [part of]: Secondary | ICD-10-CM | POA: Diagnosis not present

## 2024-04-15 ENCOUNTER — Encounter: Admitting: Nurse Practitioner

## 2024-05-24 NOTE — Patient Instructions (Signed)
 Healthy Eating, Adult Healthy eating may help you get and keep a healthy body weight, reduce the risk of chronic disease, and live a long and productive life. It is important to follow a healthy eating pattern. Your nutritional and calorie needs should be met mainly by different nutrient-rich foods. What are tips for following this plan? Reading food labels Read labels and choose the following: Reduced or low sodium products. Juices with 100% fruit juice. Foods with low saturated fats (<3 g per serving) and high polyunsaturated and monounsaturated fats. Foods with whole grains, such as whole wheat, cracked wheat, brown rice, and wild rice. Whole grains that are fortified with folic acid . This is recommended for females who are pregnant or who want to become pregnant. Read labels and do not eat or drink the following: Foods or drinks with added sugars. These include foods that contain brown sugar, corn sweetener, corn syrup, dextrose , fructose, glucose, high-fructose corn syrup, honey, invert sugar, lactose, malt syrup, maltose, molasses, raw sugar, sucrose, trehalose, or turbinado sugar. Limit your intake of added sugars to less than 10% of your total daily calories. Do not eat more than the following amounts of added sugar per day: 6 teaspoons (25 g) for females. 9 teaspoons (38 g) for males. Foods that contain processed or refined starches and grains. Refined grain products, such as white flour, degermed cornmeal, white bread, and white rice. Shopping Choose nutrient-rich snacks, such as vegetables, whole fruits, and nuts. Avoid high-calorie and high-sugar snacks, such as potato chips, fruit snacks, and candy. Use oil-based dressings and spreads on foods instead of solid fats such as butter, margarine, sour cream, or cream cheese. Limit pre-made sauces, mixes, and instant products such as flavored rice, instant noodles, and ready-made pasta. Try more plant-protein sources, such as tofu,  tempeh, black beans, edamame, lentils, nuts, and seeds. Explore eating plans such as the Mediterranean diet or vegetarian diet. Try heart-healthy dips made with beans and healthy fats like hummus and guacamole. Vegetables go great with these. Cooking Use oil to saut or stir-fry foods instead of solid fats such as butter, margarine, or lard. Try baking, boiling, grilling, or broiling instead of frying. Remove the fatty part of meats before cooking. Steam vegetables in water  or broth. Meal planning  At meals, imagine dividing your plate into fourths: One-half of your plate is fruits and vegetables. One-fourth of your plate is whole grains. One-fourth of your plate is protein, especially lean meats, poultry, eggs, tofu, beans, or nuts. Include low-fat dairy as part of your daily diet. Lifestyle Choose healthy options in all settings, including home, work, school, restaurants, or stores. Prepare your food safely: Wash your hands after handling raw meats. Where you prepare food, keep surfaces clean by regularly washing with hot, soapy water . Keep raw meats separate from ready-to-eat foods, such as fruits and vegetables. Cook seafood, meat, poultry, and eggs to the recommended temperature. Get a food thermometer. Store foods at safe temperatures. In general: Keep cold foods at 34F (4.4C) or below. Keep hot foods at 134F (60C) or above. Keep your freezer at [facility] (-17.8C) or below. Foods are not safe to eat if they have been between the temperatures of 40-134F (4.4-60C) for more than 2 hours. What foods should I eat? Fruits Aim to eat 1-2 cups of fresh, canned (in natural juice), or frozen fruits each day. One cup of fruit equals 1 small apple, 1 large banana, 8 large strawberries, 1 cup (237 g) canned fruit,  cup (82 g) dried fruit,  or 1 cup (240 mL) 100% juice. Vegetables Aim to eat 2-4 cups of fresh and frozen vegetables each day, including different varieties and colors. One cup  of vegetables equals 1 cup (91 g) broccoli or cauliflower florets, 2 medium carrots, 2 cups (150 g) raw, leafy greens, 1 large tomato, 1 large bell pepper, 1 large sweet potato, or 1 medium white potato. Grains Aim to eat 5-10 ounce-equivalents of whole grains each day. Examples of 1 ounce-equivalent of grains include 1 slice of bread, 1 cup (40 g) ready-to-eat cereal, 3 cups (24 g) popcorn, or  cup (93 g) cooked rice. Meats and other proteins Try to eat 5-7 ounce-equivalents of protein each day. Examples of 1 ounce-equivalent of protein include 1 egg,  oz nuts (12 almonds, 24 pistachios, or 7 walnut halves), 1/4 cup (90 g) cooked beans, 6 tablespoons (90 g) hummus or 1 tablespoon (16 g) peanut butter. A cut of meat or fish that is the size of a deck of cards is about 3-4 ounce-equivalents (85 g). Of the protein you eat each week, try to have at least 8 sounce (227 g) of seafood. This is about 2 servings per week. This includes salmon, trout, herring, sardines, and anchovies. Dairy Aim to eat 3 cup-equivalents of fat-free or low-fat dairy each day. Examples of 1 cup-equivalent of dairy include 1 cup (240 mL) milk, 8 ounces (250 g) yogurt, 1 ounces (44 g) natural cheese, or 1 cup (240 mL) fortified soy milk. Fats and oils Aim for about 5 teaspoons (21 g) of fats and oils per day. Choose monounsaturated fats, such as canola and olive oils, mayonnaise made with olive oil or avocado oil, avocados, peanut butter, and most nuts, or polyunsaturated fats, such as sunflower, corn, and soybean oils, walnuts, pine nuts, sesame seeds, sunflower seeds, and flaxseed. Beverages Aim for 6 eight-ounce glasses of water  per day. Limit coffee to 3-5 eight-ounce cups per day. Limit caffeinated beverages that have added calories, such as soda and energy drinks. If you drink alcohol: Limit how much you have to: 0-1 drink a day if you are male. 0-2 drinks a day if you are male. Know how much alcohol is in your drink.  In the [place] one drink is one 12 oz bottle of beer (355 mL), one 5 oz glass of wine (148 mL), or one 1 oz glass of hard liquor (44 mL). Seasoning and other foods Try not to add too much salt to your food. Try using herbs and spices instead of salt. Try not to add sugar to food. This information is based on [place] nutrition guidelines. To learn more, visit [employer]. Exact amounts may vary. You may need different amounts. This information is not intended to replace advice given to you by your health care provider. Make sure you discuss any questions you have with your health care provider. Document Revised: 03/27/2022 Document Reviewed: 03/27/2022 [employer] Patient Education  2024 [employer].

## 2024-05-26 ENCOUNTER — Encounter: Payer: Self-pay | Admitting: Nurse Practitioner

## 2024-05-26 ENCOUNTER — Ambulatory Visit (INDEPENDENT_AMBULATORY_CARE_PROVIDER_SITE_OTHER): Admitting: Nurse Practitioner

## 2024-05-26 VITALS — BP 135/72 | HR 68 | Temp 98.9°F | Resp 16 | Ht 70.0 in | Wt 189.2 lb

## 2024-05-26 DIAGNOSIS — E7800 Pure hypercholesterolemia, unspecified: Secondary | ICD-10-CM | POA: Diagnosis not present

## 2024-05-26 DIAGNOSIS — N400 Enlarged prostate without lower urinary tract symptoms: Secondary | ICD-10-CM

## 2024-05-26 DIAGNOSIS — E039 Hypothyroidism, unspecified: Secondary | ICD-10-CM | POA: Diagnosis not present

## 2024-05-26 DIAGNOSIS — R5383 Other fatigue: Secondary | ICD-10-CM

## 2024-05-26 DIAGNOSIS — Z79899 Other long term (current) drug therapy: Secondary | ICD-10-CM

## 2024-05-26 DIAGNOSIS — Z0000 Encounter for general adult medical examination without abnormal findings: Secondary | ICD-10-CM | POA: Diagnosis not present

## 2024-05-26 DIAGNOSIS — F411 Generalized anxiety disorder: Secondary | ICD-10-CM

## 2024-05-26 DIAGNOSIS — Z6827 Body mass index (BMI) 27.0-27.9, adult: Secondary | ICD-10-CM

## 2024-05-26 MED ORDER — ALPRAZOLAM 1 MG PO TABS: 1.0000 mg | ORAL_TABLET | Freq: Every day | ORAL | 0 refills | Status: AC | PRN

## 2024-05-26 NOTE — Progress Notes (Signed)
 BP 135/72 (BP Location: Left Arm, Patient Position: Sitting, Cuff Size: Normal)   Pulse 68   Temp 98.9 F (37.2 C) (Oral)   Resp 16   Ht 5' 10 (1.778 m)   Wt 189 lb 3.2 oz (85.8 kg)   SpO2 98% Comment: room air  BMI 27.15 kg/m    Subjective:    Patient ID: Sean Campbell, male    DOB: 1971-04-28, 53 y.o.   MRN: [id number]  HPI: Sean Campbell is a 53 y.o. male presenting on 05/26/2024 for comprehensive medical examination. Current medical complaints include:none  He currently lives with: wife Interim Problems from his last visit: no  HYPERLIPIDEMIA Past cholesterol meds: Atorvastatin , Rosuvastatin  -- he would like to pursue calcium  scoring Supplements: none Aspirin:  no The 10-year ASCVD risk score (Arnett DK, et al., 2019) is: 7.3%   Values used to calculate the score:     Age: 46 years     Clincally relevant sex: Male     Is Non-Hispanic African American: No     Diabetic: No     Tobacco smoker: No     Systolic Blood Pressure: 135 mmHg     Is BP treated: No     HDL Cholesterol: 52 mg/dL     Total Cholesterol: 239 mg/dL Chest pain:  no Coronary artery disease:  no Family history CAD:  yes, uncles, dad, grandfather -- MI Family history early CAD:  no    HYPOTHYROIDISM No current medications. Thyroid  control status:stable Etiology of hypothyroidism: unknown Fatigue: no Cold intolerance: no Heat intolerance: no Weight gain: no Weight loss: no Constipation: no Diarrhea/loose stools: no Palpitations: no Lower extremity edema: no Anxiety/depressed mood: no   ANXIETY/STRESS Pt is aware of risks of benzo medication use to include increased sedation, respiratory suppression, falls, dependence and cardiovascular events + some research notes higher risk for dementia.  Pt would like to continue treatment as benefit determined to outweigh risk.  Last fill 01/29/23.  Uses occasional Xanax  - rarely uses. Duration:stable Anxious mood: no Excessive worrying: no Irritability:  no Sweating: no Nausea: no Palpitations:no Hyperventilation: no Panic attacks: no Agoraphobia: no  Obscessions/compulsions: no Depressed mood: no    05/26/2024    1:11 PM 03/16/2023    8:54 AM 01/29/2023    8:15 AM 01/02/2022    8:06 AM 12/28/2020    8:28 AM  Depression screen PHQ 2/9  Decreased Interest 0 1 3 1  0  Down, Depressed, Hopeless 0 0 1 0 0  PHQ - 2 Score 0 1 4 1  0  Altered sleeping 3 1 1 1  0  Tired, decreased energy 3 2 3 2  0  Change in appetite 0 2 2 0 0  Feeling bad or failure about yourself  0 0 1 0 0  Trouble concentrating 0 1 2 0 0  Moving slowly or fidgety/restless 0 0 1 0 0  Suicidal thoughts 0 0 0 0 0  PHQ-9 Score 6 7  14  4   0   Difficult doing work/chores Not difficult at all Not difficult at all Somewhat difficult Not difficult at all Not difficult at all     Data saved with a previous flowsheet row definition  Anhedonia: yes Weight changes: no Insomnia: none Hypersomnia: no Fatigue/loss of energy: yes Feelings of worthlessness: no Feelings of guilt: no Impaired concentration/indecisiveness: no Suicidal ideations: no  Crying spells: no Recent Stressors/Life Changes: no   Relationship problems: no   Family stress: no     Financial stress:  no    Job stress: no    Recent death/loss: no     06/04/24    1:12 PM 03/16/2023    8:55 AM 01/29/2023    8:16 AM 01/02/2022    8:07 AM  GAD 7 : Generalized Anxiety Score  Nervous, Anxious, on Edge 0 1 2 1   Control/stop worrying 0 1 1 1   Worry too much - different things 0 1 1 1   Trouble relaxing 0 1 1 1   Restless 0 1 0 0  Easily annoyed or irritable 0 1 1 1   Afraid - awful might happen 0 1 0 0  Total GAD 7 Score 0 7 6 5   Anxiety Difficulty Not difficult at all Somewhat difficult Somewhat difficult Not difficult at all   Functional Status Survey: Is the patient deaf or have difficulty hearing?: No Does the patient have difficulty seeing, even when wearing glasses/contacts?: No Does the patient have  difficulty concentrating, remembering, or making decisions?: No Does the patient have difficulty walking or climbing stairs?: No Does the patient have difficulty dressing or bathing?: No Does the patient have difficulty doing errands alone such as visiting a doctor's office or shopping?: No  FALL RISK:    03/16/2023    8:54 AM 01/29/2023    8:15 AM 01/02/2022    8:06 AM 12/28/2020    8:10 AM 05/09/2018    9:09 AM  Fall Risk   Falls in the past year? 0 0 0 0 No   Number falls in past yr: 0 0 0 0   Injury with Fall? 0 0 0 0   Risk for fall due to : No Fall Risks No Fall Risks No Fall Risks No Fall Risks   Follow up Falls evaluation completed Falls evaluation completed Falls evaluation completed  Falls evaluation completed       Data saved with a previous flowsheet row definition   Past Medical History:  Past Medical History:  Diagnosis Date   Anxiety    Hyperlipemia    Hyperthyroidism     Surgical History:  Past Surgical History:  Procedure Laterality Date   COLONOSCOPY WITH PROPOFOL  N/A 01/02/2018   Procedure: COLONOSCOPY WITH PROPOFOL ;  Surgeon: Janalyn Keene NOVAK, MD;  Location: [facility] ENDOSCOPY;  Service: Endoscopy;  Laterality: N/A;    Medications:  Current Outpatient Medications on File Prior to Visit  Medication Sig   sildenafil  (VIAGRA ) 100 MG tablet TAKE 100 MG (1 TABLET) ONE HOUR PRIOR TO SEXUAL ACTIVITY. MAX DOSE ONCE DAILY. SEEK HELP FOR ERECTION LASTING LONGER THAN FOUR HOURS.   No current facility-administered medications on file prior to visit.    Allergies:  No Known Allergies  Social History:  Social History   Socioeconomic History   Marital status: Married    Spouse name: Not on file   Number of children: Not on file   Years of education: Not on file   Highest education level: Not on file  Occupational History   Not on file  Tobacco Use   Smoking status: Former   Smokeless tobacco: Never  Vaping Use   Vaping status: Never Used  Substance and  Sexual Activity   Alcohol use: Yes    Alcohol/week: 6.0 standard drinks of alcohol    Types: 6 Cans of beer per week    Comment: 6 pack per week   Drug use: No   Sexual activity: Not on file  Other Topics Concern   Not on file  Social History Narrative  Not on file   Social Drivers of Health   Financial Resource Strain: Not on file  Food Insecurity: Not on file  Transportation Needs: Not on file  Physical Activity: Not on file  Stress: Not on file  Social Connections: Not on file  Intimate Partner Violence: Not on file   Social History   Tobacco Use  Smoking Status Former  Smokeless Tobacco Never   Social History   Substance and Sexual Activity  Alcohol Use Yes   Alcohol/week: 6.0 standard drinks of alcohol   Types: 6 Cans of beer per week   Comment: 6 pack per week    Family History:  Family History  Problem Relation Age of Onset   Heart disease Father    Cancer Maternal Grandmother        colon   Heart disease Paternal Grandmother    Heart disease Paternal Grandfather     Past medical history, surgical history, medications, allergies, family history and social history reviewed with patient today and changes made to appropriate areas of the chart.   ROS All other ROS negative except what is listed above and in the HPI.      Objective:    BP 135/72 (BP Location: Left Arm, Patient Position: Sitting, Cuff Size: Normal)   Pulse 68   Temp 98.9 F (37.2 C) (Oral)   Resp 16   Ht 5' 10 (1.778 m)   Wt 189 lb 3.2 oz (85.8 kg)   SpO2 98% Comment: room air  BMI 27.15 kg/m   Wt Readings from Last 3 Encounters:  05/26/24 189 lb 3.2 oz (85.8 kg)  03/16/23 192 lb 6.4 oz (87.3 kg)  01/29/23 189 lb (85.7 kg)     Physical Exam Vitals and nursing note reviewed.  Constitutional:      General: He is awake. He is not in acute distress.    Appearance: He is well-developed and well-groomed. He is not ill-appearing or toxic-appearing.  HENT:     Head:  Normocephalic and atraumatic.     Right Ear: Hearing, tympanic membrane, ear canal and external ear normal. No drainage.     Left Ear: Hearing, tympanic membrane, ear canal and external ear normal. No drainage.     Nose: Nose normal.     Mouth/Throat:     Pharynx: Uvula midline.  Eyes:     General: Lids are normal.        Right eye: No discharge.        Left eye: No discharge.     Extraocular Movements: Extraocular movements intact.     Conjunctiva/sclera: Conjunctivae normal.     Pupils: Pupils are equal, round, and reactive to light.     Visual Fields: Right eye visual fields normal and left eye visual fields normal.  Neck:     Thyroid : No thyromegaly.     Vascular: No carotid bruit or JVD.     Trachea: Trachea normal.  Cardiovascular:     Rate and Rhythm: Normal rate and regular rhythm.     Heart sounds: Normal heart sounds, S1 normal and S2 normal. No murmur heard.    No gallop.  Pulmonary:     Effort: Pulmonary effort is normal. No accessory muscle usage or respiratory distress.     Breath sounds: Normal breath sounds.  Abdominal:     General: Bowel sounds are normal.     Palpations: Abdomen is soft. There is no hepatomegaly or splenomegaly.     Tenderness: There is no abdominal  tenderness.  Musculoskeletal:        General: Normal range of motion.     Cervical back: Normal range of motion and neck supple.     Right lower leg: No edema.     Left lower leg: No edema.  Lymphadenopathy:     Head:     Right side of head: No submental, submandibular, tonsillar, preauricular or posterior auricular adenopathy.     Left side of head: No submental, submandibular, tonsillar, preauricular or posterior auricular adenopathy.     Cervical: No cervical adenopathy.  Skin:    General: Skin is warm and dry.     Capillary Refill: Capillary refill takes less than 2 seconds.     Findings: No rash.  Neurological:     Mental Status: He is alert and oriented to person, place, and time.      Gait: Gait is intact.     Deep Tendon Reflexes: Reflexes are normal and symmetric.     Reflex Scores:      Brachioradialis reflexes are 2+ on the right side and 2+ on the left side.      Patellar reflexes are 2+ on the right side and 2+ on the left side. Psychiatric:        Attention and Perception: Attention normal.        Mood and Affect: Mood normal.        Speech: Speech normal.        Behavior: Behavior normal. Behavior is cooperative.        Thought Content: Thought content normal.        Cognition and Memory: Cognition normal.    Results for orders placed or performed in visit on 01/29/23  [id number] 11+Oxyco+Alc+Crt-Bund   Collection Time: 01/29/23  8:55 AM  Result Value Ref Range   Ethanol Negative Cutoff=0.020 %   Amphetamines, Urine Negative Cutoff=1000 ng/mL   Barbiturate Negative Cutoff=200 ng/mL   BENZODIAZ UR QL See Final Results Cutoff=200 ng/mL   Cannabinoid Quant, Ur Negative Cutoff=50 ng/mL   Cocaine (Metabolite) Negative Cutoff=300 ng/mL   OPIATE SCREEN URINE Negative Cutoff=300 ng/mL   Oxycodone /Oxymorphone , Urine Negative Cutoff=300 ng/mL   Phencyclidine Negative Cutoff=25 ng/mL   Methadone Screen, Urine Negative Cutoff=300 ng/mL   Propoxyphene Negative Cutoff=300 ng/mL   Meperidine Negative Cutoff=200 ng/mL   Tramadol Negative Cutoff=200 ng/mL   Creatinine 168.9 20.0 - 300.0 mg/dL   pH, Urine 5.7 4.5 - 8.9  Benzodiazepines Confirm, Urine   Collection Time: 01/29/23  8:55 AM  Result Value Ref Range   Benzodiazepines Positive (A) Cutoff=200 ng/mL   Nordiazepam Negative Cutoff=100   Oxazepam Negative Cutoff=100   Flurazepam Negative Cutoff=100   Lorazepam Negative Cutoff=100   Alprazolam  Positive (A)    Alprazolam  Conf. 812 Cutoff=100 ng/mL   Clonazepam Negative Cutoff=100   Temazepam Negative Cutoff=100   Triazolam Negative Cutoff=100   Midazolam Negative Cutoff=100  CBC with Differential/Platelet   Collection Time: 01/29/23  9:03 AM  Result Value Ref  Range   WBC 6.1 3.4 - 10.8 x10E3/uL   RBC 5.08 4.14 - 5.80 x10E6/uL   Hemoglobin 16.3 13.0 - 17.7 g/dL   Hematocrit 53.3 62.4 - 51.0 %   MCV 92 79 - 97 fL   MCH 32.1 26.6 - 33.0 pg   MCHC 35.0 31.5 - 35.7 g/dL   RDW 87.0 88.3 - 84.5 %   Platelets 257 150 - 450 x10E3/uL   Neutrophils 59 Not Estab. %   Lymphs 25 Not Estab. %  Monocytes 8 Not Estab. %   Eos 6 Not Estab. %   Basos 1 Not Estab. %   Neutrophils Absolute 3.6 1.4 - 7.0 x10E3/uL   Lymphocytes Absolute 1.5 0.7 - 3.1 x10E3/uL   Monocytes Absolute 0.5 0.1 - 0.9 x10E3/uL   EOS (ABSOLUTE) 0.4 0.0 - 0.4 x10E3/uL   Basophils Absolute 0.1 0.0 - 0.2 x10E3/uL   Immature Granulocytes 1 Not Estab. %   Immature Grans (Abs) 0.1 0.0 - 0.1 x10E3/uL  Comprehensive metabolic panel   Collection Time: 01/29/23  9:03 AM  Result Value Ref Range   Glucose 99 70 - 99 mg/dL   BUN 12 6 - 24 mg/dL   Creatinine, Ser 8.97 0.76 - 1.27 mg/dL   eGFR 87 >40 fO/fpw/8.26   BUN/Creatinine Ratio 12 9 - 20   Sodium 143 134 - 144 mmol/L   Potassium 4.7 3.5 - 5.2 mmol/L   Chloride 104 96 - 106 mmol/L   CO2 24 20 - 29 mmol/L   Calcium  9.5 8.7 - 10.2 mg/dL   Total Protein 7.0 6.0 - 8.5 g/dL   Albumin 4.7 3.8 - 4.9 g/dL   Globulin, Total 2.3 1.5 - 4.5 g/dL   Bilirubin Total 0.4 0.0 - 1.2 mg/dL   Alkaline Phosphatase 57 44 - 121 IU/L   AST 21 0 - 40 IU/L   ALT 43 0 - 44 IU/L  Lipid Panel w/o Chol/HDL Ratio   Collection Time: 01/29/23  9:03 AM  Result Value Ref Range   Cholesterol, Total 239 (H) 100 - 199 mg/dL   Triglycerides 879 0 - 149 mg/dL   HDL 52 >60 mg/dL   VLDL Cholesterol Cal 21 5 - 40 mg/dL   LDL Chol Calc (NIH) 833 (H) 0 - 99 mg/dL  TSH   Collection Time: 01/29/23  9:03 AM  Result Value Ref Range   TSH 2.790 0.450 - 4.500 uIU/mL  T4, free   Collection Time: 01/29/23  9:03 AM  Result Value Ref Range   Free T4 1.02 0.82 - 1.77 ng/dL  PSA   Collection Time: 01/29/23  9:03 AM  Result Value Ref Range   Prostate Specific Ag, Serum 0.2  0.0 - 4.0 ng/mL      Assessment & Plan:   Problem List Items Addressed This Visit       Endocrine   Hypothyroid   No current medications.   Last TSH within normal range.  Recheck TSH and Free T4 today, initiate medication as needed.  When he did take, he did not take medication consistently. His thyroid  antibody is negative.      Relevant Orders   T4, free   CBC with Differential/Platelet   TSH     Other   Long-term current use of benzodiazepine   Been on Benzo treatment for many years.  Started by previous PCP as he did not tolerate alternate regimens.  Continue Xanax  1 MG as needed daily, to continue to use sparingly and not daily, which we discussed.  Is aware of risks with long term use.      Hyperlipidemia - Primary   Ongoing. Recommend continued diet focus.  Lipid panel and CMP today. He would like to pursue CT Calcium  Scoring.  Ordered this today. The 10-year ASCVD risk score (Arnett DK, et al., 2019) is: 7.3%   Values used to calculate the score:     Age: 75 years     Clincally relevant sex: Male     Is Non-Hispanic African American: No  Diabetic: No     Tobacco smoker: No     Systolic Blood Pressure: 135 mmHg     Is BP treated: No     HDL Cholesterol: 52 mg/dL     Total Cholesterol: 239 mg/dL       Relevant Orders   Comprehensive metabolic panel with GFR   Lipid Panel w/o Chol/HDL Ratio   CT CARDIAC SCORING (SELF PAY ONLY)   Generalized anxiety disorder   Chronic and ongoing. Been on benzo treatment for several years.  Started by previous PCP as he did not tolerate alternate regimens.  Continue Xanax  1 MG as needed daily, to continue to use sparingly and not daily, which we discussed.  Is aware of risks with long term use.  UDS next 05/26/25 and controlled agreement up to date, discussed at length with patient.      Relevant Medications   ALPRAZolam  (XANAX ) 1 MG tablet   BMI 27.0-27.9,adult   BMI 27.15 at this time.  Recommended eating smaller high  protein, low fat meals more frequently and exercising 30 mins a day 5 times a week with a goal of 10-15lb weight loss in the next 3 months. Patient voiced their understanding and motivation to adhere to these recommendations.       Other Visit Diagnoses       Benign prostatic hyperplasia without lower urinary tract symptoms       PSA on labs today.   Relevant Orders   PSA     Fatigue, unspecified type       Check testosterone  outpatient per request.   Relevant Orders   Testosterone ,Free and Total     Encounter for annual physical exam       Annual physical today with labs and health maintenance reviewed, discussed with patient.       Discussed aspirin prophylaxis for myocardial infarction prevention and decision was it was not indicated  LABORATORY TESTING:  Health maintenance labs ordered today as discussed above.   The natural history of prostate cancer and ongoing controversy regarding screening and potential treatment outcomes of prostate cancer has been discussed with the patient. The meaning of a false positive PSA and a false negative PSA has been discussed. He indicates understanding of the limitations of this screening test and wishes to proceed with screening PSA testing.   IMMUNIZATIONS:   - Tdap: Tetanus vaccination status reviewed: Refused. - Influenza: Refused - Pneumovax: Not applicable - Prevnar: Not applicable - Zostavax vaccine: Up to date  SCREENING: - Colonoscopy: Up to date  Discussed with patient purpose of the colonoscopy is to detect colon cancer at curable precancerous or early stages   - AAA Screening: Not applicable  -Hearing Test: Not applicable  -Spirometry: Not applicable   PATIENT COUNSELING:    Sexuality: Discussed sexually transmitted diseases, partner selection, use of condoms, avoidance of unintended pregnancy  and contraceptive alternatives.   Advised to avoid cigarette smoking.  I discussed with the patient that most people either  abstain from alcohol or drink within safe limits (<=14/week and <=4 drinks/occasion for males, <=7/weeks and <= 3 drinks/occasion for females) and that the risk for alcohol disorders and other health effects rises proportionally with the number of drinks per week and how often a drinker exceeds daily limits.  Discussed cessation/primary prevention of drug use and availability of treatment for abuse.   Diet: Encouraged to adjust caloric intake to maintain  or achieve ideal body weight, to reduce intake of dietary saturated fat and total  fat, to limit sodium intake by avoiding high sodium foods and not adding table salt, and to maintain adequate dietary potassium and calcium  preferably from fresh fruits, vegetables, and low-fat dairy products.    Stressed the importance of regular exercise  Injury prevention: Discussed safety belts, safety helmets, smoke detector, smoking near bedding or upholstery.   Dental health: Discussed importance of regular tooth brushing, flossing, and dental visits.   Follow up plan: NEXT PREVENTATIVE PHYSICAL DUE IN 1 YEAR. Return in about 1 year (around 05/26/2025) for Annual Physical.

## 2024-05-26 NOTE — Assessment & Plan Note (Signed)
 Ongoing. Recommend continued diet focus.  Lipid panel and CMP today. He would like to pursue CT Calcium  Scoring.  Ordered this today. The 10-year ASCVD risk score (Arnett DK, et al., 2019) is: 7.3%   Values used to calculate the score:     Age: 53 years     Clincally relevant sex: Male     Is Non-Hispanic African American: No     Diabetic: No     Tobacco smoker: No     Systolic Blood Pressure: 135 mmHg     Is BP treated: No     HDL Cholesterol: 52 mg/dL     Total Cholesterol: 239 mg/dL

## 2024-05-26 NOTE — Assessment & Plan Note (Signed)
 Been on Benzo treatment for many years.  Started by previous PCP as he did not tolerate alternate regimens.  Continue Xanax  1 MG as needed daily, to continue to use sparingly and not daily, which we discussed.  Is aware of risks with long term use.

## 2024-05-26 NOTE — Assessment & Plan Note (Signed)
 Chronic and ongoing. Been on benzo treatment for several years.  Started by previous PCP as he did not tolerate alternate regimens.  Continue Xanax  1 MG as needed daily, to continue to use sparingly and not daily, which we discussed.  Is aware of risks with long term use.  UDS next 05/26/25 and controlled agreement up to date, discussed at length with patient.

## 2024-05-26 NOTE — Assessment & Plan Note (Signed)
 BMI 27.15 at this time.  Recommended eating smaller high protein, low fat meals more frequently and exercising 30 mins a day 5 times a week with a goal of 10-15lb weight loss in the next 3 months. Patient voiced their understanding and motivation to adhere to these recommendations.

## 2024-05-26 NOTE — Assessment & Plan Note (Signed)
No current medications.   Last TSH within normal range.  Recheck TSH and Free T4 today, initiate medication as needed.  When he did take, he did not take medication consistently. His thyroid antibody is negative.

## 2024-05-27 ENCOUNTER — Ambulatory Visit: Payer: Self-pay | Admitting: Nurse Practitioner

## 2024-05-27 DIAGNOSIS — E7800 Pure hypercholesterolemia, unspecified: Secondary | ICD-10-CM

## 2024-05-27 LAB — LIPID PANEL W/O CHOL/HDL RATIO
Cholesterol, Total: 232 mg/dL — ABNORMAL HIGH (ref 100–199)
HDL: 52 mg/dL (ref >39)
LDL Chol Calc (NIH): 165 mg/dL — ABNORMAL HIGH (ref 0–99)
Triglycerides: 83 mg/dL (ref 0–149)
VLDL Cholesterol Cal: 15 mg/dL (ref 5–40)

## 2024-05-27 LAB — COMPREHENSIVE METABOLIC PANEL WITH GFR
ALT: 22 IU/L (ref 0–44)
AST: 19 IU/L (ref 0–40)
Albumin: 4.9 g/dL (ref 3.8–4.9)
Alkaline Phosphatase: 50 IU/L (ref 47–123)
BUN/Creatinine Ratio: 14 (ref 9–20)
BUN: 12 mg/dL (ref 6–24)
Bilirubin Total: 0.6 mg/dL (ref 0.0–1.2)
CO2: 21 mmol/L (ref 20–29)
Calcium: 9.8 mg/dL (ref 8.7–10.2)
Chloride: 103 mmol/L (ref 96–106)
Creatinine, Ser: 0.87 mg/dL (ref 0.76–1.27)
Globulin, Total: 2.4 g/dL (ref 1.5–4.5)
Glucose: 85 mg/dL (ref 70–99)
Potassium: 4.8 mmol/L (ref 3.5–5.2)
Sodium: 139 mmol/L (ref 134–144)
Total Protein: 7.3 g/dL (ref 6.0–8.5)
eGFR: 102 mL/min/1.73 (ref >59)

## 2024-05-27 LAB — CBC WITH DIFFERENTIAL/PLATELET
Basophils Absolute: 0.1 x10E3/uL (ref 0.0–0.2)
Basos: 1 %
EOS (ABSOLUTE): 0.4 x10E3/uL (ref 0.0–0.4)
Eos: 6 %
Hematocrit: 47.2 % (ref 37.5–51.0)
Hemoglobin: 16 g/dL (ref 13.0–17.7)
Immature Grans (Abs): 0 x10E3/uL (ref 0.0–0.1)
Immature Granulocytes: 0 %
Lymphocytes Absolute: 1.9 x10E3/uL (ref 0.7–3.1)
Lymphs: 30 %
MCH: 31.5 pg (ref 26.6–33.0)
MCHC: 33.9 g/dL (ref 31.5–35.7)
MCV: 93 fL (ref 79–97)
Monocytes Absolute: 0.5 x10E3/uL (ref 0.1–0.9)
Monocytes: 8 %
Neutrophils Absolute: 3.5 x10E3/uL (ref 1.4–7.0)
Neutrophils: 55 %
Platelets: 281 x10E3/uL (ref 150–450)
RBC: 5.08 x10E6/uL (ref 4.14–5.80)
RDW: 12.9 % (ref 11.6–15.4)
WBC: 6.2 x10E3/uL (ref 3.4–10.8)

## 2024-05-27 LAB — PSA: Prostate Specific Ag, Serum: 0.3 ng/mL (ref 0.0–4.0)

## 2024-05-27 LAB — TSH: TSH: 2.17 u[IU]/mL (ref 0.450–4.500)

## 2024-05-27 LAB — T4, FREE: Free T4: 1.14 ng/dL (ref 0.82–1.77)

## 2024-05-27 NOTE — Progress Notes (Signed)
 Contacted via MyChart  Good morning Brenden, your labs have returned and overall remain stable with exception of lipid panel which continues to show elevations. Your cholesterol is still high, but continued recommendations to make lifestyle changes. Your LDL is above normal.  The LDL is the bad cholesterol.  Over time and in combination with inflammation and other factors, this contributes to plaque which in turn may lead to stroke and/or heart attack down the road.  Sometimes high LDL is primarily genetic, and people might be eating all the right foods but still have high numbers.  Other times, there is room for improvement in one's diet and eating healthier can bring this number down and potentially reduce one's risk of heart attack and/or stroke. To reduce your LDL, Remember - more fruits and vegetables, more fish, and limit red meat and dairy products.  More soy, nuts, beans, barley, lentils, oats and plant sterol ester enriched margarine instead of butter.  I also encourage eliminating sugar and processed food.  Remember, shop on the outside of the grocery store and visit your [employer]. Any questions? Keep being stellar!!  Thank you for allowing me to participate in your care.  I appreciate you. Kindest regards, Mackensie Pilson

## 2024-06-09 ENCOUNTER — Other Ambulatory Visit (INDEPENDENT_AMBULATORY_CARE_PROVIDER_SITE_OTHER)

## 2024-06-09 DIAGNOSIS — R5383 Other fatigue: Secondary | ICD-10-CM

## 2024-06-10 NOTE — Progress Notes (Signed)
 Good morning, please let Sean Campbell know his labs are returning, one more pending, but his total testosterone  remains in normal range at 669. We can continue to monitor this at visits, but it is above level where treatment would be recommended. Any questions? Keep being stellar!!  Thank you for allowing me to participate in your care.  I appreciate you. Kindest regards, Mirelle Biskup

## 2024-06-11 LAB — TESTOSTERONE,FREE AND TOTAL
Testosterone, Free: 13.9 pg/mL (ref 7.2–24.0)
Testosterone: 669 ng/dL (ref 264–916)

## 2024-06-18 ENCOUNTER — Ambulatory Visit
Admission: RE | Admit: 2024-06-18 | Discharge: 2024-06-18 | Disposition: A | Discharge status: Home / Self Care | Payer: Self-pay | Source: Ambulatory Visit | Attending: Nurse Practitioner | Admitting: Nurse Practitioner

## 2024-06-18 DIAGNOSIS — E7800 Pure hypercholesterolemia, unspecified: Secondary | ICD-10-CM

## 2024-07-08 NOTE — Progress Notes (Signed)
 Contacted via MyChart  Good afternoon Sean Campbell, your CT Cardiac Scoring has returned and level is slightly above goal. They have recommended we consider starting statin therapy to help prevent stroke or heart event. Do you want to trial a low dose? Let me know. Continue working on [employer] and regular exercise. Any questions?

## 2024-07-09 MED ORDER — ROSUVASTATIN CALCIUM 10 MG PO TABS: 10.0000 mg | ORAL_TABLET | Freq: Every day | ORAL | 3 refills | Status: AC

## 2024-07-11 ENCOUNTER — Encounter: Payer: Self-pay | Admitting: Emergency Medicine

## 2024-07-11 ENCOUNTER — Ambulatory Visit
Admission: EM | Admit: 2024-07-11 | Discharge: 2024-07-11 | Disposition: A | Discharge status: Home / Self Care | Attending: Emergency Medicine | Admitting: Emergency Medicine

## 2024-07-11 DIAGNOSIS — M545 Low back pain, unspecified: Secondary | ICD-10-CM

## 2024-07-11 MED ORDER — KETOROLAC TROMETHAMINE 30 MG/ML IJ SOLN
30.0000 mg | Freq: Once | INTRAMUSCULAR | Status: AC
  Administered 2024-07-11: 30 mg via INTRAMUSCULAR

## 2024-07-11 MED ORDER — CYCLOBENZAPRINE HCL 10 MG PO TABS: 10.0000 mg | ORAL_TABLET | Freq: Every day | ORAL | 0 refills | Status: AC

## 2024-07-11 MED ORDER — PREDNISONE 10 MG (21) PO TBPK: ORAL_TABLET | Freq: Every day | ORAL | 0 refills | Status: AC

## 2024-07-11 NOTE — ED Provider Notes (Signed)
 " [name]    CSN: [id number] Arrival date & time: 07/11/24  1018      History   Chief Complaint Chief Complaint  Patient presents with   Back Pain    HPI Sean Campbell is a 54 y.o. male.   Patient presents for evaluation of constant mid low back pain and left-sided low back pain beginning 4 days ago without precipitating event, injury or trauma.  Pain intermittently radiates into the buttocks exacerbated by long periods of sitting or standing.  Denies presence of numbness or tingling or urinary or bowel incontinence.  Has attempted use of Tylenol  and ibuprofen without relief.     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   BMI 27.0-27.9,adult 12/25/2019   History of smoking 01/24/2019   Hypothyroid 05/09/2018   Generalized anxiety disorder 05/09/2018   Polyp of sigmoid colon    Diverticulosis of large intestine without diverticulitis    Internal hemorrhoids    ED (erectile dysfunction) 07/17/2017   Hyperlipidemia 05/26/2013    Past Surgical History:  Procedure Laterality Date   COLONOSCOPY WITH PROPOFOL  N/A 01/02/2018   Procedure: COLONOSCOPY WITH PROPOFOL ;  Surgeon: Janalyn Keene NOVAK, MD;  Location: [facility] ENDOSCOPY;  Service: Endoscopy;  Laterality: N/A;       Home Medications    Prior to Admission medications  Medication Sig Start Date End Date Taking? Authorizing Provider  ALPRAZolam  (XANAX ) 1 MG tablet Take 1 tablet (1 mg total) by mouth daily as needed for anxiety (for severe anxiety only). 05/26/24   Cannady, Jolene T, NP  rosuvastatin  (CRESTOR ) 10 MG tablet Take 1 tablet (10 mg total) by mouth daily. 07/09/24   Cannady, Jolene T, NP  sildenafil  (VIAGRA ) 100 MG tablet TAKE 100 MG (1 TABLET) ONE HOUR PRIOR TO SEXUAL ACTIVITY. MAX DOSE ONCE DAILY. SEEK HELP  FOR ERECTION LASTING LONGER THAN FOUR HOURS. 02/22/24   Cannady, Jolene T, NP    Family History Family History  Problem Relation Age of Onset   Heart disease Father    Cancer Maternal Grandmother        colon   Heart disease Paternal Grandmother    Heart disease Paternal Grandfather     Social History Social History[1]   Allergies   Patient has no known allergies.   Review of Systems Review of Systems   Physical Exam Triage Vital Signs ED Triage Vitals  Encounter Vitals Group     BP 07/11/24 1301 124/82     Girls Systolic BP Percentile --      Girls Diastolic BP Percentile --      Boys Systolic BP Percentile --      Boys Diastolic BP Percentile --      Pulse Rate 07/11/24 1301 79     Resp 07/11/24 1301 20     Temp 07/11/24 1301 97.6 F (36.4 C)     Temp Source 07/11/24 1301 Oral     SpO2 07/11/24 1301 97 %     Weight --      Height --      Head Circumference --      Peak Flow --      Pain Score 07/11/24 1303 9     Pain Loc --      Pain Education --  Exclude from Growth Chart --    No data found.  Updated Vital Signs BP 124/82 (BP Location: Right Arm)   Pulse 79   Temp 97.6 F (36.4 C) (Oral)   Resp 20   SpO2 97%   Visual Acuity Right Eye Distance:   Left Eye Distance:   Bilateral Distance:    Right Eye Near:   Left Eye Near:    Bilateral Near:     Physical Exam Constitutional:      Appearance: Normal appearance.  Eyes:     Extraocular Movements: Extraocular movements intact.  Pulmonary:     Effort: Pulmonary effort is normal.  Musculoskeletal:     Comments: Unable to reproduce tenderness to the back, no ecchymosis swelling or deformity, exacerbated by lateral turn and bending but able to complete full range of motion, no spinal tenderness noted, negative straight leg test  Neurological:     Mental Status: He is alert and oriented to person, place, and time.      UC Treatments / Results  Labs (all labs ordered are listed, but only  abnormal results are displayed) Labs Reviewed - No data to display  EKG   Radiology No results found.  Procedures Procedures (including critical care time)  Medications Ordered in UC Medications - No data to display  Initial Impression / Assessment and Plan / UC Course  I have reviewed the triage vital signs and the nursing notes.  Pertinent labs & imaging results that were available during my care of the patient were reviewed by me and considered in my medical decision making (see chart for details).  Acute midline low back pain without sciatica  Etiology most likely muscular, denying injury therefore imaging deferred, discussed this with patient, Toradol IM given and prescribed prednisone  and Flexeril  for home use recommended supportive care through [facility], heat massage with activity as tolerated, back stretches given within discharge summary, recommended orthopedic follow-up for any persisting or worsening symptoms Final Clinical Impressions(s) / UC Diagnoses   Final diagnoses:  None   Discharge Instructions   None    ED Prescriptions   None    PDMP not reviewed this encounter.     [1]  Social History Tobacco Use   Smoking status: Former   Smokeless tobacco: Never  Vaping Use   Vaping status: Never Used  Substance Use Topics   Alcohol use: Yes    Alcohol/week: 6.0 standard drinks of alcohol    Types: 6 Cans of beer per week    Comment: 6 pack per week   Drug use: No     Teresa Shelba SAUNDERS, NP 07/11/24 1332  "

## 2024-07-11 NOTE — Discharge Instructions (Addendum)
 Your pain is most likely caused by irritation to the muscles.   You have been given an injection of Toradol to reduce inflammation and pain and ideally will start to see some relief within the next 30 minutes to an hour  Starting tomorrow take prednisone  as directed to continue to process above, may use any Tylenol  or topical medicines additionally  May use muscle relaxant at bedtime for additional comfort  You may use heating pad in 15 minute intervals as needed for additional comfort  Begin stretching affected area daily for 10 minutes as tolerated to further loosen muscles, exercises in packet  When sitting and  lying down place pillow underneath and between knees for support  Can try sleeping without pillow on firm mattress as this helps keeps the spine in alignment  Practice good posture: head back, shoulders back, chest forward, pelvis back and weight distributed evenly on both legs  If pain persist after recommended treatment or reoccurs if may be beneficial to follow up with orthopedic specialist for evaluation, this doctor specializes in the bones and can manage your symptoms long-term with options such as but not limited to imaging, medications or physical therapy

## 2024-07-11 NOTE — ED Triage Notes (Signed)
 Patient reports lower back pain x 4 days. Patient has taken Ibuprofen and Tylenol  with no relief. Patient has not taken anything today for pain. Rates pain 9/10.

## 2025-05-28 ENCOUNTER — Encounter: Admitting: Nurse Practitioner
